# Patient Record
Sex: Female | Born: 1955 | Race: White | Hispanic: No | Marital: Single | Smoking: Former smoker
Health system: Southern US, Community
[De-identification: ages and names within clinical notes are randomized; demographics above are authoritative.]

## PROBLEM LIST (undated history)

## (undated) DIAGNOSIS — M1990 Unspecified osteoarthritis, unspecified site: Secondary | ICD-10-CM

## (undated) DIAGNOSIS — Z87442 Personal history of urinary calculi: Secondary | ICD-10-CM

## (undated) DIAGNOSIS — D649 Anemia, unspecified: Secondary | ICD-10-CM

## (undated) DIAGNOSIS — J189 Pneumonia, unspecified organism: Secondary | ICD-10-CM

## (undated) DIAGNOSIS — C569 Malignant neoplasm of unspecified ovary: Secondary | ICD-10-CM

## (undated) DIAGNOSIS — K219 Gastro-esophageal reflux disease without esophagitis: Secondary | ICD-10-CM

## (undated) HISTORY — PX: HIP ARTHROPLASTY: SHX981

## (undated) HISTORY — PX: ABDOMINAL HYSTERECTOMY: SHX81

## (undated) HISTORY — PX: FINGER SURGERY: SHX640

---

## 2020-03-16 ENCOUNTER — Encounter (HOSPITAL_COMMUNITY): Payer: Managed Care, Other (non HMO)

## 2020-03-16 ENCOUNTER — Other Ambulatory Visit (HOSPITAL_COMMUNITY): Payer: Self-pay

## 2020-03-20 ENCOUNTER — Ambulatory Visit
Admission: RE | Admit: 2020-03-20 | Payer: Managed Care, Other (non HMO) | Source: Home / Self Care | Admitting: Orthopedic Surgery

## 2020-03-20 ENCOUNTER — Encounter: Admission: RE | Payer: Self-pay | Source: Home / Self Care

## 2020-03-20 SURGERY — ARTHROPLASTY, HIP, TOTAL, ANTERIOR APPROACH
Anesthesia: Spinal | Site: Hip | Laterality: Right

## 2020-05-01 ENCOUNTER — Ambulatory Visit: Admit: 2020-05-01 | Payer: Managed Care, Other (non HMO) | Admitting: Orthopedic Surgery

## 2020-05-01 SURGERY — ARTHROPLASTY, HIP, TOTAL, ANTERIOR APPROACH
Anesthesia: Spinal | Site: Hip | Laterality: Left

## 2020-05-18 ENCOUNTER — Emergency Department (HOSPITAL_COMMUNITY): Payer: Managed Care, Other (non HMO)

## 2020-05-18 ENCOUNTER — Emergency Department (HOSPITAL_COMMUNITY)
Admission: EM | Admit: 2020-05-18 | Discharge: 2020-05-18 | Disposition: A | Discharge status: Home / Self Care | Payer: Managed Care, Other (non HMO) | Attending: Emergency Medicine | Admitting: Emergency Medicine

## 2020-05-18 ENCOUNTER — Other Ambulatory Visit: Payer: Self-pay

## 2020-05-18 ENCOUNTER — Encounter (HOSPITAL_COMMUNITY): Payer: Self-pay | Admitting: Emergency Medicine

## 2020-05-18 DIAGNOSIS — S73005A Unspecified dislocation of left hip, initial encounter: Secondary | ICD-10-CM | POA: Diagnosis not present

## 2020-05-18 DIAGNOSIS — Z96642 Presence of left artificial hip joint: Secondary | ICD-10-CM | POA: Insufficient documentation

## 2020-05-18 DIAGNOSIS — Y92003 Bedroom of unspecified non-institutional (private) residence as the place of occurrence of the external cause: Secondary | ICD-10-CM | POA: Insufficient documentation

## 2020-05-18 DIAGNOSIS — S79912A Unspecified injury of left hip, initial encounter: Secondary | ICD-10-CM | POA: Diagnosis present

## 2020-05-18 DIAGNOSIS — W06XXXA Fall from bed, initial encounter: Secondary | ICD-10-CM | POA: Insufficient documentation

## 2020-05-18 DIAGNOSIS — Z7982 Long term (current) use of aspirin: Secondary | ICD-10-CM | POA: Diagnosis not present

## 2020-05-18 DIAGNOSIS — M25562 Pain in left knee: Secondary | ICD-10-CM

## 2020-05-18 DIAGNOSIS — M25561 Pain in right knee: Secondary | ICD-10-CM | POA: Diagnosis not present

## 2020-05-18 MED ORDER — KETAMINE HCL 50 MG/5ML IJ SOSY
60.0000 mg | PREFILLED_SYRINGE | Freq: Once | INTRAMUSCULAR | Status: AC
  Administered 2020-05-18: 30 mg via INTRAVENOUS
  Filled 2020-05-18: qty 10

## 2020-05-18 MED ORDER — METHOCARBAMOL 1000 MG/10ML IJ SOLN
1000.0000 mg | Freq: Once | INTRAVENOUS | Status: AC
  Administered 2020-05-18: 1000 mg via INTRAVENOUS
  Filled 2020-05-18: qty 1000

## 2020-05-18 MED ORDER — SODIUM CHLORIDE 0.9 % IV BOLUS
1000.0000 mL | Freq: Once | INTRAVENOUS | Status: AC
  Administered 2020-05-18: 1000 mL via INTRAVENOUS

## 2020-05-18 MED ORDER — PROPOFOL 10 MG/ML IV BOLUS
60.0000 mg | Freq: Once | INTRAVENOUS | Status: AC
  Administered 2020-05-18: 30 mg via INTRAVENOUS
  Filled 2020-05-18: qty 20

## 2020-05-18 MED ORDER — ONDANSETRON HCL 4 MG/2ML IJ SOLN
4.0000 mg | Freq: Once | INTRAMUSCULAR | Status: AC
  Administered 2020-05-18: 4 mg via INTRAVENOUS
  Filled 2020-05-18: qty 2

## 2020-05-18 MED ORDER — METHOCARBAMOL 1000 MG/10ML IJ SOLN: 1000.0000 mg | Freq: Once | INTRAMUSCULAR | Status: DC

## 2020-05-18 MED ORDER — HYDROMORPHONE HCL 1 MG/ML IJ SOLN
0.5000 mg | Freq: Once | INTRAMUSCULAR | Status: AC
  Administered 2020-05-18: 0.5 mg via INTRAVENOUS
  Filled 2020-05-18: qty 1

## 2020-05-18 MED ORDER — ACETAMINOPHEN 325 MG PO TABS
650.0000 mg | ORAL_TABLET | Freq: Once | ORAL | Status: AC
  Administered 2020-05-18: 650 mg via ORAL
  Filled 2020-05-18: qty 2

## 2020-05-18 NOTE — ED Provider Notes (Signed)
[facility] DEPT Provider Note  CSN: [id number] Arrival date & time: 05/18/20 3299  Chief Complaint(s) Fall  HPI Miranda Graham is a 64 y.o. female who presents to the emergency department for left leg/hip pain. Pain is severe and throbbing. Exacerbated with movement, range of motion and palpation. Alleviated by immobility and the fentanyl given to her by EMS. She reports rolling out of bed and landing on her hip. She has a history of left hip replacement. She denies any head trauma or loss of consciousness. She is not anticoagulated. She denies any neck pain or back pain. No other extremity pain. No other physical complaints.  HPI  Past Medical History History reviewed. No pertinent past medical history. There are no problems to display for this patient.  Home Medication(s) Prior to Admission medications   Medication Sig Start Date End Date Taking? Authorizing Provider  acetaminophen (TYLENOL) 325 MG tablet Take 975 mg by mouth every 6 (six) hours as needed for mild pain, fever or headache.   Yes [provider]  aspirin 325 MG tablet Take 325 mg by mouth 2 (two) times daily.   Yes [provider]  Ferrous Sulfate (IRON PO) Take 1 tablet by mouth 2 (two) times daily.   Yes [provider]  ibuprofen (ADVIL) 200 MG tablet Take 800 mg by mouth every 6 (six) hours as needed for fever, headache or mild pain.   Yes [provider]                                                                                                                                    Past Surgical History Past Surgical History:  Procedure Laterality Date  . ABDOMINAL HYSTERECTOMY     Family History History reviewed. No pertinent family history.  Social History Social History   Tobacco Use  . Smoking status: Never Smoker  . Smokeless tobacco: Never Used  Substance Use Topics  . Alcohol use: Never  . Drug use: Never    Allergies Shellfish allergy and Levofloxacin  Review of Systems Review of Systems All other systems are reviewed and are negative for acute change except as noted in the HPI  Physical Exam Vital Signs  I have reviewed the triage vital signs BP (!) 149/88   Pulse 96   Temp 98.6 F (37 C) (Oral)   Resp 17   Ht 5\' 3"  (1.6 m)   Wt 77.1 kg   SpO2 95%   BMI 30.11 kg/m   Physical Exam Constitutional:      General: She is not in acute distress.    Appearance: She is well-developed. She is not diaphoretic.  HENT:     Head: Normocephalic and atraumatic.     Right Ear: External ear normal.     Left Ear: External ear normal.     Nose: Nose normal.  Eyes:     General: No scleral icterus.  Right eye: No discharge.        Left eye: No discharge.     Conjunctiva/sclera: Conjunctivae normal.     Pupils: Pupils are equal, round, and reactive to light.  Cardiovascular:     Rate and Rhythm: Normal rate and regular rhythm.     Pulses:          Radial pulses are 2+ on the right side and 2+ on the left side.       Dorsalis pedis pulses are 2+ on the right side and 2+ on the left side.     Heart sounds: Normal heart sounds. No murmur heard.  No friction rub. No gallop.   Pulmonary:     Effort: Pulmonary effort is normal. No respiratory distress.     Breath sounds: Normal breath sounds. No stridor. No wheezing.  Abdominal:     General: There is no distension.     Palpations: Abdomen is soft.     Tenderness: There is no abdominal tenderness.  Musculoskeletal:     Cervical back: Normal range of motion and neck supple. No bony tenderness.     Thoracic back: No bony tenderness.     Lumbar back: Tenderness present. No bony tenderness.       Back:     Left hip: Tenderness and bony tenderness present.       Legs:     Comments: Clavicles stable. Chest stable to AP/Lat compression. Pelvis stable to Lat compression. No obvious extremity deformity. No chest or abdominal wall  contusion.  Skin:    General: Skin is warm and dry.     Findings: No erythema or rash.  Neurological:     Mental Status: She is alert and oriented to person, place, and time.     Comments: Moving all extremities     ED Results and Treatments Labs (all labs ordered are listed, but only abnormal results are displayed) Labs Reviewed - No data to display                                                                                                                       EKG  EKG Interpretation  Date/Time:    Ventricular Rate:    PR Interval:    QRS Duration:   QT Interval:    QTC Calculation:   R Axis:     Text Interpretation:        Radiology DG Lumbar Spine 2-3 Views  Result Date: 05/18/2020 CLINICAL DATA:  Fall from bed.  Low back pain.  Left hip pain. EXAM: LUMBAR SPINE - 2-3 VIEW COMPARISON:  None. FINDINGS: Left hip is dislocated superiorly. Advanced degenerative changes are present the right hip. Five non rib-bearing lumbar type vertebral bodies are present. Chronic loss of disc height is present and L5-S1 slight retrolisthesis is present at L1-2. Leftward curvature is present in lumbar spine. No acute fracture or traumatic subluxation is present. Atherosclerotic changes are noted in the aorta and branch vessels without aneurysm.  IMPRESSION: 1. Superior dislocation of the left hip. 2. 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 3. Multilevel degenerative changes in the lumbar spine. 4. No acute fracture or traumatic subluxation. 5. Atherosclerosis. Electronically Signed   By: San Morelle M.D.   On: 05/18/2020 06:26   DG Knee 1-2 Views Left  Result Date: 05/18/2020 CLINICAL DATA:  Fall from bed.  Left lower extremity pain. EXAM: LEFT KNEE - 1-2 VIEW COMPARISON:  None. FINDINGS: Degenerative changes present knee. No significant effusion is present. No radiopaque foreign body is present. No acute fracture is present. IMPRESSION: 1. No acute abnormality. 2. Moderate  degenerative changes. Electronically Signed   By: San Morelle M.D.   On: 05/18/2020 06:24   DG Hip Unilat W or Wo Pelvis 2-3 Views Left  Result Date: 05/18/2020 CLINICAL DATA:  Postreduction. EXAM: DG HIP (WITH OR WITHOUT PELVIS) 2-3V LEFT COMPARISON:  Left hip radiographs 05/18/2020 at 5:29 a.m. FINDINGS: The left hip is reduced.  No fractures are present. IMPRESSION: Interval reduction of left hip dislocation. Electronically Signed   By: San Morelle M.D.   On: 05/18/2020 07:27   DG HIP UNILAT W OR W/O PELVIS 2-3 VIEWS LEFT  Result Date: 05/18/2020 CLINICAL DATA:  Fall from bed.  Left hip pain. EXAM: DG HIP (WITH OR WITHOUT PELVIS) 2-3V LEFT COMPARISON:  None. FINDINGS: Left total hip arthroplasty is present. The hip is dislocated superiorly. No fractures are present. Degenerative changes are noted in the right hip. Surgical clips are present in the pelvis bilaterally. IMPRESSION: 1. Superior dislocation of the left total hip arthroplasty. 2. No acute fracture. Electronically Signed   By: San Morelle M.D.   On: 05/18/2020 06:23   DG FEMUR MIN 2 VIEWS LEFT  Result Date: 05/18/2020 CLINICAL DATA:  Fall from bed.  Left hip pain. EXAM: LEFT FEMUR 2 VIEWS COMPARISON:  None. FINDINGS: Images the distal femur demonstrate no acute fracture. Degenerative changes are present in the knee. IMPRESSION: 1. No acute fracture. 2. Degenerative changes of the knee. Electronically Signed   By: San Morelle M.D.   On: 05/18/2020 06:24    Pertinent labs & imaging results that were available during my care of the patient were reviewed by me and considered in my medical decision making (see chart for details).  Medications Ordered in ED Medications  ondansetron (ZOFRAN) injection 4 mg (has no administration in time range)  acetaminophen (TYLENOL) tablet 650 mg (has no administration in time range)  sodium chloride 0.9 % bolus 1,000 mL (0 mLs Intravenous Stopped 05/18/20 0542)   HYDROmorphone (DILAUDID) injection 0.5 mg (0.5 mg Intravenous Given 05/18/20 0451)  methocarbamol (ROBAXIN) 1,000 mg in dextrose 5 % 100 mL IVPB (0 mg Intravenous Stopped 05/18/20 0542)  propofol (DIPRIVAN) 10 mg/mL bolus/IV push 60 mg (30 mg Intravenous Given 05/18/20 0644)  ketamine 50 mg in normal saline 5 mL (10 mg/mL) syringe (30 mg Intravenous Given 05/18/20 0644)  Procedures .Sedation  Date/Time: 05/18/2020 6:58 AM Performed by: Fatima Blank, MD Authorized by: Fatima Blank, MD   Consent:    Consent obtained:  Verbal and written   Consent given by:  Patient   Risks discussed:  Allergic reaction, dysrhythmia, inadequate sedation, nausea, prolonged hypoxia resulting in organ damage, prolonged sedation necessitating reversal, respiratory compromise necessitating ventilatory assistance and intubation and vomiting   Alternatives discussed:  Analgesia without sedation, anxiolysis and regional anesthesia Universal protocol:    Procedure explained and questions answered to patient or proxy's satisfaction: yes     Relevant documents present and verified: yes     Test results available and properly labeled: yes     Imaging studies available: yes     Required blood products, implants, devices, and special equipment available: yes     Site/side marked: yes     Immediately prior to procedure a time out was called: yes     Patient identity confirmation method:  Verbally with patient and arm band Indications:    Procedure necessitating sedation performed by:  Physician performing sedation Pre-sedation assessment:    Time since last food or drink:  2000   ASA classification: class 2 - patient with mild systemic disease     Neck mobility: normal     Mouth opening:  3 or more finger widths   Thyromental distance:  4 finger widths   Mallampati  score:  I - soft palate, uvula, fauces, pillars visible   Pre-sedation assessments completed and reviewed: airway patency, cardiovascular function, hydration status, mental status, nausea/vomiting, pain level, respiratory function and temperature     Pre-sedation assessment completed:  05/18/2020 6:00 AM Immediate pre-procedure details:    Reassessment: Patient reassessed immediately prior to procedure     Reviewed: vital signs, relevant labs/tests and NPO status     Verified: bag valve mask available, emergency equipment available, intubation equipment available, IV patency confirmed, oxygen available and suction available   Procedure details (see MAR for exact dosages):    Preoxygenation:  Nasal cannula   Sedation:  Propofol   Intended level of sedation: deep   Intra-procedure monitoring:  Blood pressure monitoring, cardiac monitor, continuous pulse oximetry, frequent LOC assessments, frequent vital sign checks and continuous capnometry   Intra-procedure events: none     Total Provider sedation time (minutes):  15 Post-procedure details:    Post-sedation assessment completed:  05/18/2020 7:04 AM   Attendance: Constant attendance by certified staff until patient recovered     Recovery: Patient returned to pre-procedure baseline     Post-sedation assessments completed and reviewed: airway patency, cardiovascular function, hydration status, mental status, nausea/vomiting, pain level, respiratory function and temperature     Patient is stable for discharge or admission: yes     Patient tolerance:  Tolerated well, no immediate complications .Ortho Injury Treatment  Date/Time: 05/18/2020 7:04 AM Performed by: Fatima Blank, MD Authorized by: Fatima Blank, MD   Consent:    Consent obtained:  Written   Consent given by:  Patient   Risks discussed:  Fracture, nerve damage, restricted joint movement, vascular damage, stiffness, recurrent dislocation and irreducible  dislocation   Alternatives discussed:  No treatmentInjury location: hip Location details: left hip Injury type: dislocation Dislocation type: posterior Prosthesis: yes Pre-procedure neurovascular assessment: neurovascularly intact  Patient sedated: Yes. Refer to sedation procedure documentation for details of sedation. Manipulation performed: yes Reduction method: Bigelow technique Reduction successful: yes X-ray confirmed reduction: yes Immobilization: brace Post-procedure neurovascular assessment: post-procedure neurovascularly  intact Patient tolerance: patient tolerated the procedure well with no immediate complications     (including critical care time)  Medical Decision Making / ED Course I have reviewed the nursing notes for this encounter and the patient's prior records (if available in EHR or on provided paperwork).   Miranda Graham was evaluated in Emergency Department on 05/18/2020 for the symptoms described in the history of present illness. She was evaluated in the context of the global COVID-19 pandemic, which necessitated consideration that the patient might be at risk for infection with the SARS-CoV-2 virus that causes COVID-19. Institutional protocols and algorithms that pertain to the evaluation of patients at risk for COVID-19 are in a state of rapid change based on information released by regulatory bodies including the CDC and federal and state organizations. These policies and algorithms were followed during the patient's care in the ED.    Clinical Course as of May 18 734  Fri May 18, 2020  0422 Rolled out of bed onto left hip. TTP of left knee, femur, hip, and lumbar. Will give IVF, IV pain meds, and muscle relaxer.  Plain films ordered to assess for fracture, dislocation, or hardware stability.   [PC]  6948 Plain films notable for left hip dislocation. Currently awaiting radiology read to determine whether she has any other subtle fractures.   [PC]   [id number] Radiology contact me reported that there were no other acute findings on plain films.  We'll move forward with procedural sedation and reduction in the emergency department.   [PC]  [id number] Reduced as above. Tolerated well. Knee immobilizer applied.  She lives will family and already has appointment with Ortho for next week.   [PC]    Clinical Course User Index [PC] Salif Tay, Grayce Sessions, MD     Final Clinical Impression(s) / ED Diagnoses Final diagnoses:  Left knee pain  History of left hip replacement   The patient appears reasonably screened and/or stabilized for discharge and I doubt any other medical condition or other [facility] requiring further screening, evaluation, or treatment in the ED at this time prior to discharge. Safe for discharge with strict return precautions.  Disposition: Discharge  Condition: Good  I have discussed the results, Dx and Tx plan with the patient/family who expressed understanding and agree(s) with the plan. Discharge instructions discussed at length. The patient/family was given strict return precautions who verbalized understanding of the instructions. No further questions at time of discharge.    ED Discharge Orders    None       Follow Up: Orthopedic Surgery  Schedule an appointment as soon as possible for a visit        This chart was dictated using voice recognition software.  Despite best efforts to proofread,  errors can occur which can change the documentation meaning.   Fatima Blank, MD 05/18/20 [phone]

## 2020-05-18 NOTE — ED Notes (Signed)
Miranda Graham (sister) - [phone]

## 2020-05-18 NOTE — ED Notes (Signed)
Patient transported to X-ray 

## 2020-05-18 NOTE — ED Triage Notes (Signed)
Pt roll out of bed while sleep c/o left hip pain IV established fentanyl 100 mcg given for pain 10/10 decreased to 5/10 Pt alert x3 no deformity noted per EMS. PMHx hip surgery right hip schedule for left hip replacement soon

## 2020-05-18 NOTE — Discharge Instructions (Addendum)
For pain control you may take at 1000 mg of Tylenol every 8 hours scheduled.

## 2020-07-25 ENCOUNTER — Other Ambulatory Visit (HOSPITAL_COMMUNITY): Payer: Self-pay | Admitting: Orthopedic Surgery

## 2020-07-25 DIAGNOSIS — M1611 Unilateral primary osteoarthritis, right hip: Secondary | ICD-10-CM

## 2020-07-25 DIAGNOSIS — Z96642 Presence of left artificial hip joint: Secondary | ICD-10-CM

## 2020-07-26 ENCOUNTER — Other Ambulatory Visit: Payer: Self-pay

## 2020-07-26 ENCOUNTER — Other Ambulatory Visit (HOSPITAL_COMMUNITY): Payer: Self-pay | Admitting: Orthopedic Surgery

## 2020-07-26 ENCOUNTER — Ambulatory Visit (HOSPITAL_COMMUNITY)
Admission: RE | Admit: 2020-07-26 | Discharge: 2020-07-26 | Disposition: A | Discharge status: Home / Self Care | Payer: Commercial Managed Care - HMO | Source: Ambulatory Visit | Attending: Vascular Surgery | Admitting: Vascular Surgery

## 2020-07-26 DIAGNOSIS — M1611 Unilateral primary osteoarthritis, right hip: Secondary | ICD-10-CM

## 2020-07-26 DIAGNOSIS — M25473 Effusion, unspecified ankle: Secondary | ICD-10-CM

## 2020-07-26 DIAGNOSIS — Z96642 Presence of left artificial hip joint: Secondary | ICD-10-CM

## 2021-03-19 NOTE — Progress Notes (Addendum)
COVID Vaccine Completed:  Yes x4 Date COVID Vaccine completed: 09-14-19, 10-19-19 Has received booster: Yes x2 COVID vaccine manufacturer: [employer]   Date of COVID positive in last 90 days: No  PCP - Elita Quick, MD notes Care Everywhere Cardiologist - Lujean Amel, MD note Care Everywhere  Chest x-ray - 08-30-20 Care Everywhere EKG - 03-20-21 Epic Stress Test - N/A ECHO - N/A Cardiac Cath - N/A Pacemaker/ICD device last checked: Spinal Cord Stimulator:  Sleep Study - N/A CPAP -   Fasting Blood Sugar - N/A Checks Blood Sugar _____ times a day  Blood Thinner Instructions:  N/A Aspirin Instructions: Last Dose:  Activity level:  Can go up a flight of stairs and perform activities of daily living without stopping and without symptoms of chest pain or shortness of breath.  Patient does have limitations due to hip pain.       Anesthesia review:  Eval by cardiology for SOB, lower extremity edema , dyspnea on exertion and chest tightness.  Patient states that all these symptoms have resolved.  No swelling noted to extremities at PAT  Patient denies shortness of breath, fever, cough and chest pain at PAT appointment  Patient verbalized understanding of instructions that were given to them at the PAT appointment. Patient was also instructed that they will need to review over the PAT instructions again at home before surgery.

## 2021-03-19 NOTE — Patient Instructions (Addendum)
DUE TO COVID-19 ONLY ONE VISITOR IS ALLOWED TO COME WITH YOU AND STAY IN THE WAITING ROOM ONLY DURING PRE OP AND PROCEDURE.   **NO VISITORS ARE ALLOWED IN THE SHORT STAY AREA OR RECOVERY ROOM!!**  IF YOU WILL BE ADMITTED INTO THE HOSPITAL YOU ARE ALLOWED ONLY TWO SUPPORT PEOPLE DURING VISITATION HOURS ONLY (10AM -8PM)   The support person(s) may change daily. The support person(s) must pass our screening, gel in and out, and wear a mask at all times, including in the patient's room. Patients must also wear a mask when staff or their support person are in the room.  No visitors under the age of 68. Any visitor under the age of 61 must be accompanied by an adult.    COVID SWAB TESTING MUST BE COMPLETED ON:  Friday, 03-29-21 between the hours of 8 and 3  **MUST PRESENT COMPLETED FORM AT TESTING SITE**    [place] [employer] (backside of the building)  You are not required to quarantine, however you are required to wear a well-fitted mask when you are out and around people not in your household.  Hand Hygiene often Do NOT share personal items Notify your provider if you are in close contact with someone who has COVID or you develop fever 100.4 or greater, new onset of sneezing, cough, sore throat, shortness of breath or body aches.   Your procedure is scheduled on: Tuesday, 04-02-21   Report to [facility] Main  Entrance   Report to admitting at 8:45 AM   Call this number if you have problems the morning of surgery [phone]   Do not eat food :After Midnight.   May have liquids until 8:30 AM day of surgery  CLEAR LIQUID DIET  Foods Allowed                                                                     Foods Excluded  Water, Black Coffee and tea, regular and decaf               liquids that you cannot  Plain Jell-O in any flavor  (No red)                                     see through such as: Fruit ices (not with fruit pulp)                                       milk, soups, orange juice              Iced Popsicles (No red)                                      All solid food                                   Apple juices Sports drinks like Gatorade (No red) Lightly  seasoned clear broth or consume(fat free) Sugar, honey syrup      Complete one Ensure drink the morning of surgery at 8:30 AM  the day of surgery.      The day of surgery:  Drink ONE (1) Pre-Surgery Clear Ensure  the morning of surgery. Drink in one sitting. Do not sip.  This drink was given to you during your hospital  pre-op appointment visit. Nothing else to drink after completing the Pre-Surgery Clear Ensure          If you have questions, please contact your surgeon's office.     Oral Hygiene is also important to reduce your risk of infection.                                    Remember - BRUSH YOUR TEETH THE MORNING OF SURGERY WITH YOUR REGULAR TOOTHPASTE   Do NOT smoke after Midnight   Take these medicines the morning of surgery with A SIP OF WATER: Gabapentin, Tramadol                             You may not have any metal on your body including hair pins, jewelry, and body piercing             Do not wear make-up, lotions, powders, perfumes, or deodorant  Do not wear nail polish including gel and S&S, artificial/acrylic nails, or any other type of covering on natural nails including finger and toenails. If you have artificial nails, gel coating, etc. that needs to be removed by a nail salon please have this removed prior to surgery or surgery may need to be canceled/ delayed if the surgeon/ anesthesia feels like they are unable to be safely monitored.   Do not shave  48 hours prior to surgery.       Do not bring valuables to the hospital. [place].   Contacts, dentures or bridgework may not be worn into surgery.   Bring small overnight bag day of surgery.   Special Instructions: Bring a copy of your healthcare  power of attorney and living will documents         the day of surgery if you haven't scanned them in before.   Please read over the following fact sheets you were given: IF YOU HAVE QUESTIONS ABOUT YOUR PRE OP INSTRUCTIONS PLEASE CALL [facility] Preparing for Surgery Before surgery, you can play an important role.  Because skin is not sterile, your skin needs to be as free of germs as possible.  You can reduce the number of germs on your skin by washing with CHG (chlorahexidine gluconate) soap before surgery.  CHG is an antiseptic cleaner which kills germs and bonds with the skin to continue killing germs even after washing. Please DO NOT use if you have an allergy to CHG or antibacterial soaps.  If your skin becomes reddened/irritated stop using the CHG and inform your nurse when you arrive at Short Stay. Do not shave (including legs and underarms) for at least 48 hours prior to the first CHG shower.  You may shave your face/neck.  Please follow these instructions carefully:  1.  Shower with CHG Soap the night before surgery and the  morning of surgery.  2.  If you choose to wash  your hair, wash your hair first as usual with your normal  shampoo.  3.  After you shampoo, rinse your hair and body thoroughly to remove the shampoo.                             4.  Use CHG as you would any other liquid soap.  You can apply chg directly to the skin and wash.  Gently with a scrungie or clean washcloth.  5.  Apply the CHG Soap to your body ONLY FROM THE NECK DOWN.   Do   not use on face/ open                           Wound or open sores. Avoid contact with eyes, ears mouth and   genitals (private parts).                       Wash face,  Genitals (private parts) with your normal soap.             6.  Wash thoroughly, paying special attention to the area where your    surgery  will be performed.  7.  Thoroughly rinse your body with warm water from the neck down.  8.  DO NOT  shower/wash with your normal soap after using and rinsing off the CHG Soap.                9.  Pat yourself dry with a clean towel.            10.  Wear clean pajamas.            11.  Place clean sheets on your bed the night of your first shower and do not  sleep with pets. Day of Surgery : Do not apply any lotions/deodorants the morning of surgery.  Please wear clean clothes to the hospital/surgery center.  FAILURE TO FOLLOW THESE INSTRUCTIONS MAY RESULT IN THE CANCELLATION OF YOUR SURGERY  PATIENT SIGNATURE_________________________________  NURSE SIGNATURE__________________________________  ________________________________________________________________________   Adam Phenix  An incentive spirometer is a tool that can help keep your lungs clear and active. This tool measures how well you are filling your lungs with each breath. Taking long deep breaths may help reverse or decrease the chance of developing breathing (pulmonary) problems (especially infection) following: A long period of time when you are unable to move or be active. BEFORE THE PROCEDURE  If the spirometer includes an indicator to show your best effort, your nurse or respiratory therapist will set it to a desired goal. If possible, sit up straight or lean slightly forward. Try not to slouch. Hold the incentive spirometer in an upright position. INSTRUCTIONS FOR USE  Sit on the edge of your bed if possible, or sit up as far as you can in bed or on a chair. Hold the incentive spirometer in an upright position. Breathe out normally. Place the mouthpiece in your mouth and seal your lips tightly around it. Breathe in slowly and as deeply as possible, raising the piston or the ball toward the top of the column. Hold your breath for 3-5 seconds or for as long as possible. Allow the piston or ball to fall to the bottom of the column. Remove the mouthpiece from your mouth and breathe out normally. Rest for a few  seconds and repeat Steps 1 through 7 at  least 10 times every 1-2 hours when you are awake. Take your time and take a few normal breaths between deep breaths. The spirometer may include an indicator to show your best effort. Use the indicator as a goal to work toward during each repetition. After each set of 10 deep breaths, practice coughing to be sure your lungs are clear. If you have an incision (the cut made at the time of surgery), support your incision when coughing by placing a pillow or rolled up towels firmly against it. Once you are able to get out of bed, walk around indoors and cough well. You may stop using the incentive spirometer when instructed by your caregiver.  RISKS AND COMPLICATIONS Take your time so you do not get dizzy or light-headed. If you are in pain, you may need to take or ask for pain medication before doing incentive spirometry. It is harder to take a deep breath if you are having pain. AFTER USE Rest and breathe slowly and easily. It can be helpful to keep track of a log of your progress. Your caregiver can provide you with a simple table to help with this. If you are using the spirometer at home, follow these instructions: [place] IF:  You are having difficultly using the spirometer. You have trouble using the spirometer as often as instructed. Your pain medication is not giving enough relief while using the spirometer. You develop fever of 100.5 F (38.1 C) or higher. SEEK IMMEDIATE MEDICAL CARE IF:  You cough up bloody sputum that had not been present before. You develop fever of 102 F (38.9 C) or greater. You develop worsening pain at or near the incision site. MAKE SURE YOU:  Understand these instructions. Will watch your condition. Will get help right away if you are not doing well or get worse. Document Released: 12/01/2006 Document Revised: 10/13/2011 Document Reviewed: 02/01/2007 ExitCare Patient Information 2014 ExitCare,  [place].   ________________________________________________________________________  WHAT IS A BLOOD TRANSFUSION? Blood Transfusion Information  A transfusion is the replacement of blood or some of its parts. Blood is made up of multiple cells which provide different functions. Red blood cells carry oxygen and are used for blood loss replacement. White blood cells fight against infection. Platelets control bleeding. Plasma helps clot blood. Other blood products are available for specialized needs, such as hemophilia or other clotting disorders. BEFORE THE TRANSFUSION  Who gives blood for transfusions?  Healthy volunteers who are fully evaluated to make sure their blood is safe. This is blood bank blood. Transfusion therapy is the safest it has ever been in the practice of medicine. Before blood is taken from a donor, a complete history is taken to make sure that person has no history of diseases nor engages in risky social behavior (examples are intravenous drug use or sexual activity with multiple partners). The donor's travel history is screened to minimize risk of transmitting infections, such as malaria. The donated blood is tested for signs of infectious diseases, such as HIV and hepatitis. The blood is then tested to be sure it is compatible with you in order to minimize the chance of a transfusion reaction. If you or a relative donates blood, this is often done in anticipation of surgery and is not appropriate for emergency situations. It takes many days to process the donated blood. RISKS AND COMPLICATIONS Although transfusion therapy is very safe and saves many lives, the main dangers of transfusion include:  Getting an infectious disease. Developing a transfusion reaction.  This is an allergic reaction to something in the blood you were given. Every precaution is taken to prevent this. The decision to have a blood transfusion has been considered carefully by your caregiver before blood is  given. Blood is not given unless the benefits outweigh the risks. AFTER THE TRANSFUSION Right after receiving a blood transfusion, you will usually feel much better and more energetic. This is especially true if your red blood cells have gotten low (anemic). The transfusion raises the level of the red blood cells which carry oxygen, and this usually causes an energy increase. The nurse administering the transfusion will monitor you carefully for complications. HOME CARE INSTRUCTIONS  No special instructions are needed after a transfusion. You may find your energy is better. Speak with your caregiver about any limitations on activity for underlying diseases you may have. SEEK MEDICAL CARE IF:  Your condition is not improving after your transfusion. You develop redness or irritation at the intravenous (IV) site. SEEK IMMEDIATE MEDICAL CARE IF:  Any of the following symptoms occur over the next 12 hours: Shaking chills. You have a temperature by mouth above 102 F (38.9 C), not controlled by medicine. Chest, back, or muscle pain. People around you feel you are not acting correctly or are confused. Shortness of breath or difficulty breathing. Dizziness and fainting. You get a rash or develop hives. You have a decrease in urine output. Your urine turns a dark color or changes to pink, red, or brown. Any of the following symptoms occur over the next 10 days: You have a temperature by mouth above 102 F (38.9 C), not controlled by medicine. Shortness of breath. Weakness after normal activity. The white part of the eye turns yellow (jaundice). You have a decrease in the amount of urine or are urinating less often. Your urine turns a dark color or changes to pink, red, or brown. Document Released: 07/18/2000 Document Revised: 10/13/2011 Document Reviewed: 03/06/2008 [facility] Patient Information 2014 [place], [place].  _______________________________________________________________________

## 2021-03-20 ENCOUNTER — Encounter (HOSPITAL_COMMUNITY): Payer: Self-pay

## 2021-03-20 ENCOUNTER — Encounter (HOSPITAL_COMMUNITY)
Admission: RE | Admit: 2021-03-20 | Discharge: 2021-03-20 | Disposition: A | Discharge status: Home / Self Care | Payer: Medicare Other | Source: Ambulatory Visit | Attending: Orthopedic Surgery | Admitting: Orthopedic Surgery

## 2021-03-20 ENCOUNTER — Other Ambulatory Visit: Payer: Self-pay

## 2021-03-20 DIAGNOSIS — I444 Left anterior fascicular block: Secondary | ICD-10-CM | POA: Insufficient documentation

## 2021-03-20 DIAGNOSIS — Z01818 Encounter for other preprocedural examination: Secondary | ICD-10-CM | POA: Diagnosis not present

## 2021-03-20 HISTORY — DX: Anemia, unspecified: D64.9

## 2021-03-20 HISTORY — DX: Personal history of urinary calculi: Z87.442

## 2021-03-20 HISTORY — DX: Malignant neoplasm of unspecified ovary: C56.9

## 2021-03-20 HISTORY — DX: Unspecified osteoarthritis, unspecified site: M19.90

## 2021-03-20 HISTORY — DX: Gastro-esophageal reflux disease without esophagitis: K21.9

## 2021-03-20 LAB — COMPREHENSIVE METABOLIC PANEL
ALT: 17 U/L (ref 0–44)
AST: 17 U/L (ref 15–41)
Albumin: 4 g/dL (ref 3.5–5.0)
Alkaline Phosphatase: 77 U/L (ref 38–126)
Anion gap: 10 (ref 5–15)
BUN: 19 mg/dL (ref 8–23)
CO2: 27 mmol/L (ref 22–32)
Calcium: 9.4 mg/dL (ref 8.9–10.3)
Chloride: 105 mmol/L (ref 98–111)
Creatinine, Ser: 0.9 mg/dL (ref 0.44–1.00)
GFR, Estimated: >60 mL/min (ref >60)
Glucose, Bld: 73 mg/dL (ref 70–99)
Potassium: 4.7 mmol/L (ref 3.5–5.1)
Sodium: 142 mmol/L (ref 135–145)
Total Bilirubin: 0.5 mg/dL (ref 0.3–1.2)
Total Protein: 7.3 g/dL (ref 6.5–8.1)

## 2021-03-20 LAB — CBC
HCT: 35.9 % — ABNORMAL LOW (ref 36.0–46.0)
Hemoglobin: 10.7 g/dL — ABNORMAL LOW (ref 12.0–15.0)
MCH: 26.6 pg (ref 26.0–34.0)
MCHC: 29.8 g/dL — ABNORMAL LOW (ref 30.0–36.0)
MCV: 89.3 fL (ref 80.0–100.0)
Platelets: 376 10*3/uL (ref 150–400)
RBC: 4.02 MIL/uL (ref 3.87–5.11)
RDW: 17.2 % — ABNORMAL HIGH (ref 11.5–15.5)
WBC: 7.6 10*3/uL (ref 4.0–10.5)
nRBC: 0 % (ref 0.0–0.2)

## 2021-03-20 LAB — SURGICAL PCR SCREEN
MRSA, PCR: NEGATIVE
Staphylococcus aureus: POSITIVE — AB

## 2021-03-20 LAB — TYPE AND SCREEN
ABO/RH(D): A POS
Antibody Screen: NEGATIVE

## 2021-03-21 NOTE — Progress Notes (Signed)
PCR results sent to Dr. Alvan Dame.

## 2021-03-21 NOTE — Progress Notes (Addendum)
Anesthesia Chart Review:   Case: [id number] Date/Time: 04/02/21 1115   Procedure: TOTAL HIP ARTHROPLASTY ANTERIOR APPROACH (Right: Hip)   Anesthesia type: Spinal   Pre-op diagnosis: Right hip osteoarthritis   Location: [facility] ROOM 10 / [facility]   Surgeons: Paralee Cancel, MD       DISCUSSION: Pt is 65 years old with hx anemia.   Saw cardiologist Lujean Amel, MD 09/25/20 for BLE edema, SOB, and DOE (notes in care everywhere). He documented pt has "vague chest tightness." His note indicates he suspects CHF and recommended echo and Lexiscan Myoview. I do not see that these tests happened.   Reviewed case with Dr. Royce Macadamia. Pt will need cardiac clearance for surgery. I notified Judeen Hammans in Dr. Aurea Graff office.   Addendum:  Pt seen by cardiology again 03/28/2021.  At this time cardiology cleared pt for surgery. Clearance on chart which states pt is low risk for planned procedure.  VS: BP 134/73   Pulse 85   Temp 37 C (Oral)   Resp 18   Ht '5\' 2"'$  (1.575 m)   Wt 79.4 kg   SpO2 98%   BMI 32.01 kg/m   PROVIDERS: - PCP is Noel Journey, MD (notes in care everywhere)    LABS: Labs reviewed: Acceptable for surgery. (all labs ordered are listed, but only abnormal results are displayed)  Labs Reviewed  SURGICAL PCR SCREEN - Abnormal; Notable for the following components:      Result Value   Staphylococcus aureus POSITIVE (*)    All other components within normal limits  CBC - Abnormal; Notable for the following components:   Hemoglobin 10.7 (*)    HCT 35.9 (*)    MCHC 29.8 (*)    RDW 17.2 (*)    All other components within normal limits  COMPREHENSIVE METABOLIC PANEL  TYPE AND SCREEN    EKG 03/20/21: NSR. LAFB. Moderate voltage criteria for LVH, may be normal variant ( R in aVL , Cornell product ). Septal infarct , age undetermined. Possible Lateral infarct , age undetermined   CV: N/A  Past Medical History:  Diagnosis Date   Anemia    Arthritis    GERD (gastroesophageal reflux  disease)    History of kidney stones    Ovarian cancer ([place])     Past Surgical History:  Procedure Laterality Date   ABDOMINAL HYSTERECTOMY     FINGER SURGERY Right    HIP ARTHROPLASTY Left     MEDICATIONS:  ferrous sulfate 325 (65 FE) MG tablet   furosemide (LASIX) 20 MG tablet   gabapentin (NEURONTIN) 100 MG capsule   methylphenidate (RITALIN) 20 MG tablet   Multiple Vitamin (MULTIVITAMIN WITH MINERALS) TABS tablet   potassium chloride (KLOR-CON) 10 MEQ tablet   traMADol (ULTRAM) 50 MG tablet   No current facility-administered medications for this encounter.    Willeen Cass, PhD, FNP-BC [facility]/Anesthesiology Phone: [phone] 03/21/2021 3:02 PM

## 2021-03-29 ENCOUNTER — Other Ambulatory Visit: Payer: Self-pay | Admitting: Orthopedic Surgery

## 2021-03-29 LAB — SARS CORONAVIRUS 2 (TAT 6-24 HRS): SARS Coronavirus 2: NEGATIVE

## 2021-03-31 NOTE — H&P (Signed)
TOTAL HIP ADMISSION H&P  Patient is admitted for right total hip arthroplasty.  Subjective:  Chief Complaint: right hip pain  HPI: Miranda Graham, 65 y.o. female, has a history of pain and functional disability in the right hip(s) due to arthritis and patient has failed non-surgical conservative treatments for greater than 12 weeks to include NSAID's and/or analgesics, corticosteriod injections, and activity modification.  Onset of symptoms was gradual starting 2 years ago with gradually worsening course since that time.The patient noted no past surgery on the right hip(s).  Patient currently rates pain in the right hip at 9 out of 10 with activity. Patient has worsening of pain with activity and weight bearing, pain that interfers with activities of daily living, and pain with passive range of motion. Patient has evidence of joint space narrowing and flattening of the femoral head  by imaging studies. This condition presents safety issues increasing the risk of falls. There is no current active infection.  There are no problems to display for this patient.  Past Medical History:  Diagnosis Date   Anemia    Arthritis    GERD (gastroesophageal reflux disease)    History of kidney stones    Ovarian cancer ([place])     Past Surgical History:  Procedure Laterality Date   ABDOMINAL HYSTERECTOMY     FINGER SURGERY Right    HIP ARTHROPLASTY Left     No current facility-administered medications for this encounter.   Current Outpatient Medications  Medication Sig Dispense Refill Last Dose   ferrous sulfate 325 (65 FE) MG tablet Take 325 mg by mouth in the morning.      furosemide (LASIX) 20 MG tablet Take 20 mg by mouth in the morning.      gabapentin (NEURONTIN) 100 MG capsule Take 100 mg by mouth in the morning.      methylphenidate (RITALIN) 20 MG tablet Take 20 mg by mouth 5 (five) times daily as needed (attention/focus).      Multiple Vitamin (MULTIVITAMIN WITH MINERALS) TABS tablet  Take 1 tablet by mouth in the morning.      potassium chloride (KLOR-CON) 10 MEQ tablet Take 10 mEq by mouth in the morning.      traMADol (ULTRAM) 50 MG tablet Take 50 mg by mouth every 6 (six) hours as needed (pain.).      Allergies  Allergen Reactions   Shellfish Allergy Itching, Swelling and Other (See Comments)    Facial swelling   Levofloxacin Other (See Comments)    Muscle Pain Pain in calves, muscle    Social History   Tobacco Use   Smoking status: Former    Packs/day: 1.00    Years: 15.00    Pack years: 15.00    Types: Cigarettes   Smokeless tobacco: Never  Substance Use Topics   Alcohol use: Never    No family history on file.   Review of Systems  Constitutional:  Negative for chills and fever.  Respiratory:  Negative for cough and shortness of breath.   Cardiovascular:  Negative for chest pain.  Gastrointestinal:  Negative for nausea and vomiting.  Musculoskeletal:  Positive for arthralgias.   Objective:  Physical Exam Well nourished and well developed. General: Alert and oriented x3, cooperative and pleasant, no acute distress. Head: normocephalic, atraumatic, neck supple. Eyes: EOMI.  Musculoskeletal: Right hip exam: Based on her radiographic appearance of her hip I chose to not aggravate her right hip with passive or active range of motion assessment. She is neurovascular  intact distally. Left hip exam: Range of motion of left hip was not assessed Tenderness laterally  Calves soft and nontender. Motor function intact in LE. Strength 5/5 LE bilaterally. Neuro: Distal pulses 2+. Sensation to light touch intact in LE.  Vital signs in last 24 hours:    Labs:   Estimated body mass index is 32.01 kg/m as calculated from the following:   Height as of 03/20/21: '5\' 2"'$  (1.575 m).   Weight as of 03/20/21: 79.4 kg.   Imaging Review Plain radiographs demonstrate severe degenerative joint disease of the right hip(s). The bone quality appears to be  adequate for age and reported activity level.      Assessment/Plan:  End stage arthritis, right hip(s)  The patient history, physical examination, clinical judgement of the provider and imaging studies are consistent with end stage degenerative joint disease of the right hip(s) and total hip arthroplasty is deemed medically necessary. The treatment options including medical management, injection therapy, arthroscopy and arthroplasty were discussed at length. The risks and benefits of total hip arthroplasty were presented and reviewed. The risks due to aseptic loosening, infection, stiffness, dislocation/subluxation,  thromboembolic complications and other imponderables were discussed.  The patient acknowledged the explanation, agreed to proceed with the plan and consent was signed. Patient is being admitted for inpatient treatment for surgery, pain control, PT, OT, prophylactic antibiotics, VTE prophylaxis, progressive ambulation and ADL's and discharge planning.The patient is planning to be discharged  home.  Therapy Plans: HEP Disposition: Home with sister Planned DVT Prophylaxis: aspirin '81mg'$  BID DME needed: none PCP: Dr. Fransisco Beau, clearance received Cardiologist: Dr. 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

## 2021-04-02 ENCOUNTER — Observation Stay (HOSPITAL_COMMUNITY): Payer: Medicare Other

## 2021-04-02 ENCOUNTER — Encounter (HOSPITAL_COMMUNITY): Payer: Self-pay | Admitting: Orthopedic Surgery

## 2021-04-02 ENCOUNTER — Encounter (HOSPITAL_COMMUNITY)
Admission: RE | Disposition: A | Discharge status: Home / Self Care | Payer: Self-pay | Source: Ambulatory Visit | Attending: Orthopedic Surgery

## 2021-04-02 ENCOUNTER — Observation Stay (HOSPITAL_COMMUNITY)
Admission: RE | Admit: 2021-04-02 | Discharge: 2021-04-03 | Disposition: A | Discharge status: Home / Self Care | Payer: Medicare Other | Source: Ambulatory Visit | Attending: Orthopedic Surgery | Admitting: Orthopedic Surgery

## 2021-04-02 ENCOUNTER — Other Ambulatory Visit: Payer: Self-pay

## 2021-04-02 ENCOUNTER — Ambulatory Visit (HOSPITAL_COMMUNITY): Payer: Medicare Other | Admitting: Anesthesiology

## 2021-04-02 ENCOUNTER — Ambulatory Visit (HOSPITAL_COMMUNITY): Payer: Medicare Other | Admitting: Emergency Medicine

## 2021-04-02 ENCOUNTER — Ambulatory Visit (HOSPITAL_COMMUNITY): Payer: Medicare Other

## 2021-04-02 DIAGNOSIS — Z96642 Presence of left artificial hip joint: Secondary | ICD-10-CM | POA: Insufficient documentation

## 2021-04-02 DIAGNOSIS — Z96649 Presence of unspecified artificial hip joint: Secondary | ICD-10-CM

## 2021-04-02 DIAGNOSIS — M1611 Unilateral primary osteoarthritis, right hip: Secondary | ICD-10-CM | POA: Diagnosis not present

## 2021-04-02 DIAGNOSIS — Z87891 Personal history of nicotine dependence: Secondary | ICD-10-CM | POA: Insufficient documentation

## 2021-04-02 DIAGNOSIS — Z8543 Personal history of malignant neoplasm of ovary: Secondary | ICD-10-CM | POA: Diagnosis not present

## 2021-04-02 DIAGNOSIS — Z419 Encounter for procedure for purposes other than remedying health state, unspecified: Secondary | ICD-10-CM

## 2021-04-02 HISTORY — PX: TOTAL HIP ARTHROPLASTY: SHX124

## 2021-04-02 LAB — ABO/RH: ABO/RH(D): A POS

## 2021-04-02 SURGERY — ARTHROPLASTY, HIP, TOTAL, ANTERIOR APPROACH
Anesthesia: Spinal | Site: Hip | Laterality: Right

## 2021-04-02 MED ORDER — PHENYLEPHRINE HCL (PRESSORS) 10 MG/ML IV SOLN
INTRAVENOUS | Status: DC | PRN
  Administered 2021-04-02: 120 ug via INTRAVENOUS

## 2021-04-02 MED ORDER — TRANEXAMIC ACID-NACL 1000-0.7 MG/100ML-% IV SOLN
1000.0000 mg | INTRAVENOUS | Status: AC
  Administered 2021-04-02: 1000 mg via INTRAVENOUS
  Filled 2021-04-02: qty 100

## 2021-04-02 MED ORDER — PROPOFOL 500 MG/50ML IV EMUL
INTRAVENOUS | Status: DC | PRN
  Administered 2021-04-02: 75 ug/kg/min via INTRAVENOUS

## 2021-04-02 MED ORDER — ORAL CARE MOUTH RINSE: 15.0000 mL | Freq: Once | OROMUCOSAL | Status: AC

## 2021-04-02 MED ORDER — METOCLOPRAMIDE HCL 5 MG/ML IJ SOLN: 5.0000 mg | Freq: Three times a day (TID) | INTRAMUSCULAR | Status: DC | PRN

## 2021-04-02 MED ORDER — METHOCARBAMOL 500 MG PO TABS
500.0000 mg | ORAL_TABLET | Freq: Four times a day (QID) | ORAL | Status: DC | PRN
  Administered 2021-04-02: 500 mg via ORAL

## 2021-04-02 MED ORDER — EPHEDRINE SULFATE 50 MG/ML IJ SOLN
INTRAMUSCULAR | Status: DC | PRN
Start: 2021-04-02 — End: 2021-04-02
  Administered 2021-04-02: 10 mg via INTRAVENOUS
  Administered 2021-04-02: 15 mg via INTRAVENOUS

## 2021-04-02 MED ORDER — PHENOL 1.4 % MT LIQD: 1.0000 | OROMUCOSAL | Status: DC | PRN

## 2021-04-02 MED ORDER — CEFAZOLIN SODIUM-DEXTROSE 2-4 GM/100ML-% IV SOLN
2.0000 g | Freq: Four times a day (QID) | INTRAVENOUS | Status: AC
Start: 2021-04-02 — End: 2021-04-03
  Administered 2021-04-02 (×2): 2 g via INTRAVENOUS
  Filled 2021-04-02 (×2): qty 100

## 2021-04-02 MED ORDER — POLYETHYLENE GLYCOL 3350 17 G PO PACK: 17.0000 g | PACK | Freq: Every day | ORAL | Status: DC | PRN

## 2021-04-02 MED ORDER — ACETAMINOPHEN 325 MG PO TABS: 325.0000 mg | ORAL_TABLET | Freq: Four times a day (QID) | ORAL | Status: DC | PRN

## 2021-04-02 MED ORDER — ONDANSETRON HCL 4 MG/2ML IJ SOLN: 4.0000 mg | Freq: Four times a day (QID) | INTRAMUSCULAR | Status: DC | PRN

## 2021-04-02 MED ORDER — DOCUSATE SODIUM 100 MG PO CAPS
100.0000 mg | ORAL_CAPSULE | Freq: Two times a day (BID) | ORAL | Status: DC
  Administered 2021-04-02 – 2021-04-03 (×2): 100 mg via ORAL
  Filled 2021-04-02 (×2): qty 1

## 2021-04-02 MED ORDER — FENTANYL CITRATE PF 50 MCG/ML IJ SOSY
PREFILLED_SYRINGE | INTRAMUSCULAR | Status: AC
  Filled 2021-04-02: qty 3

## 2021-04-02 MED ORDER — BISACODYL 10 MG RE SUPP: 10.0000 mg | Freq: Every day | RECTAL | Status: DC | PRN

## 2021-04-02 MED ORDER — BUPIVACAINE IN DEXTROSE 0.75-8.25 % IT SOLN
INTRATHECAL | Status: DC | PRN
  Administered 2021-04-02: 1.6 mL via INTRATHECAL

## 2021-04-02 MED ORDER — POTASSIUM CHLORIDE CRYS ER 10 MEQ PO TBCR
10.0000 meq | EXTENDED_RELEASE_TABLET | Freq: Every morning | ORAL | Status: DC
  Administered 2021-04-03: 10 meq via ORAL
  Filled 2021-04-02: qty 1

## 2021-04-02 MED ORDER — ONDANSETRON HCL 4 MG PO TABS: 4.0000 mg | ORAL_TABLET | Freq: Four times a day (QID) | ORAL | Status: DC | PRN

## 2021-04-02 MED ORDER — DEXAMETHASONE SODIUM PHOSPHATE 10 MG/ML IJ SOLN
8.0000 mg | Freq: Once | INTRAMUSCULAR | Status: AC
  Administered 2021-04-02: 5 mg via INTRAVENOUS

## 2021-04-02 MED ORDER — METHYLPHENIDATE HCL 10 MG PO TABS: 20.0000 mg | ORAL_TABLET | Freq: Every day | ORAL | Status: DC | PRN

## 2021-04-02 MED ORDER — POVIDONE-IODINE 10 % EX SWAB
2.0000 "application " | Freq: Once | CUTANEOUS | Status: AC
  Administered 2021-04-02: 2 via TOPICAL

## 2021-04-02 MED ORDER — CHLORHEXIDINE GLUCONATE 0.12 % MT SOLN
15.0000 mL | Freq: Once | OROMUCOSAL | Status: AC
  Administered 2021-04-02: 15 mL via OROMUCOSAL

## 2021-04-02 MED ORDER — PHENYLEPHRINE HCL-NACL 20-0.9 MG/250ML-% IV SOLN
INTRAVENOUS | Status: DC | PRN
  Administered 2021-04-02: 40 ug/min via INTRAVENOUS

## 2021-04-02 MED ORDER — METHOCARBAMOL 500 MG PO TABS
ORAL_TABLET | ORAL | Status: AC
  Filled 2021-04-02: qty 1

## 2021-04-02 MED ORDER — FERROUS SULFATE 325 (65 FE) MG PO TABS
325.0000 mg | ORAL_TABLET | Freq: Three times a day (TID) | ORAL | Status: DC
  Administered 2021-04-03 (×2): 325 mg via ORAL
  Filled 2021-04-02 (×2): qty 1

## 2021-04-02 MED ORDER — HYDROCODONE-ACETAMINOPHEN 7.5-325 MG PO TABS
ORAL_TABLET | ORAL | Status: AC
  Administered 2021-04-02: 1 via ORAL
  Filled 2021-04-02: qty 1

## 2021-04-02 MED ORDER — SODIUM CHLORIDE 0.9 % IV SOLN: INTRAVENOUS | Status: DC

## 2021-04-02 MED ORDER — METOCLOPRAMIDE HCL 5 MG PO TABS: 5.0000 mg | ORAL_TABLET | Freq: Three times a day (TID) | ORAL | Status: DC | PRN

## 2021-04-02 MED ORDER — METHOCARBAMOL 500 MG IVPB - SIMPLE MED
500.0000 mg | Freq: Four times a day (QID) | INTRAVENOUS | Status: DC | PRN
  Filled 2021-04-02: qty 50

## 2021-04-02 MED ORDER — DEXAMETHASONE SODIUM PHOSPHATE 10 MG/ML IJ SOLN
10.0000 mg | Freq: Once | INTRAMUSCULAR | Status: AC
  Administered 2021-04-03: 10 mg via INTRAVENOUS
  Filled 2021-04-02: qty 1

## 2021-04-02 MED ORDER — ACETAMINOPHEN 500 MG PO TABS
1000.0000 mg | ORAL_TABLET | Freq: Once | ORAL | Status: AC
  Administered 2021-04-02: 1000 mg via ORAL
  Filled 2021-04-02: qty 2

## 2021-04-02 MED ORDER — LACTATED RINGERS IV SOLN: INTRAVENOUS | Status: DC

## 2021-04-02 MED ORDER — MORPHINE SULFATE (PF) 2 MG/ML IV SOLN
0.5000 mg | INTRAVENOUS | Status: DC | PRN
  Administered 2021-04-02: 1 mg via INTRAVENOUS
  Filled 2021-04-02: qty 1

## 2021-04-02 MED ORDER — DIPHENHYDRAMINE HCL 12.5 MG/5ML PO ELIX: 12.5000 mg | ORAL_SOLUTION | ORAL | Status: DC | PRN

## 2021-04-02 MED ORDER — FENTANYL CITRATE PF 50 MCG/ML IJ SOSY: 25.0000 ug | PREFILLED_SYRINGE | INTRAMUSCULAR | Status: DC | PRN

## 2021-04-02 MED ORDER — FUROSEMIDE 20 MG PO TABS
20.0000 mg | ORAL_TABLET | Freq: Every morning | ORAL | Status: DC
  Administered 2021-04-03: 20 mg via ORAL
  Filled 2021-04-02: qty 1

## 2021-04-02 MED ORDER — PROMETHAZINE HCL 25 MG/ML IJ SOLN
INTRAMUSCULAR | Status: AC
  Filled 2021-04-02: qty 1

## 2021-04-02 MED ORDER — TRANEXAMIC ACID-NACL 1000-0.7 MG/100ML-% IV SOLN
1000.0000 mg | Freq: Once | INTRAVENOUS | Status: AC
  Administered 2021-04-02: 1000 mg via INTRAVENOUS
  Filled 2021-04-02: qty 100

## 2021-04-02 MED ORDER — FENTANYL CITRATE (PF) 100 MCG/2ML IJ SOLN
INTRAMUSCULAR | Status: DC | PRN
  Administered 2021-04-02: 100 ug via INTRAVENOUS

## 2021-04-02 MED ORDER — ASPIRIN 81 MG PO CHEW
81.0000 mg | CHEWABLE_TABLET | Freq: Two times a day (BID) | ORAL | Status: DC
  Administered 2021-04-02 – 2021-04-03 (×2): 81 mg via ORAL
  Filled 2021-04-02 (×2): qty 1

## 2021-04-02 MED ORDER — GABAPENTIN 100 MG PO CAPS
100.0000 mg | ORAL_CAPSULE | Freq: Every morning | ORAL | Status: DC
  Administered 2021-04-03: 100 mg via ORAL
  Filled 2021-04-02: qty 1

## 2021-04-02 MED ORDER — FENTANYL CITRATE (PF) 100 MCG/2ML IJ SOLN
INTRAMUSCULAR | Status: AC
  Filled 2021-04-02: qty 2

## 2021-04-02 MED ORDER — STERILE WATER FOR IRRIGATION IR SOLN
Status: DC | PRN
  Administered 2021-04-02: 2000 mL

## 2021-04-02 MED ORDER — PROMETHAZINE HCL 25 MG/ML IJ SOLN
6.2500 mg | INTRAMUSCULAR | Status: DC | PRN
  Administered 2021-04-02: 6.25 mg via INTRAVENOUS

## 2021-04-02 MED ORDER — HYDROCODONE-ACETAMINOPHEN 7.5-325 MG PO TABS
1.0000 | ORAL_TABLET | ORAL | Status: DC | PRN
  Administered 2021-04-02: 1 via ORAL
  Administered 2021-04-02 – 2021-04-03 (×4): 2 via ORAL
  Filled 2021-04-02 (×4): qty 2

## 2021-04-02 MED ORDER — SODIUM CHLORIDE 0.9 % IR SOLN
Status: DC | PRN
  Administered 2021-04-02: 1000 mL

## 2021-04-02 MED ORDER — HYDROCODONE-ACETAMINOPHEN 7.5-325 MG PO TABS
ORAL_TABLET | ORAL | Status: AC
  Filled 2021-04-02: qty 1

## 2021-04-02 MED ORDER — CEFAZOLIN SODIUM-DEXTROSE 2-4 GM/100ML-% IV SOLN
2.0000 g | INTRAVENOUS | Status: AC
  Administered 2021-04-02: 2 g via INTRAVENOUS
  Filled 2021-04-02: qty 100

## 2021-04-02 MED ORDER — MENTHOL 3 MG MT LOZG: 1.0000 | LOZENGE | OROMUCOSAL | Status: DC | PRN

## 2021-04-02 MED ORDER — METHOCARBAMOL 500 MG IVPB - SIMPLE MED
INTRAVENOUS | Status: AC
  Filled 2021-04-02: qty 50

## 2021-04-02 MED ORDER — ONDANSETRON HCL 4 MG/2ML IJ SOLN
INTRAMUSCULAR | Status: DC | PRN
  Administered 2021-04-02: 4 mg via INTRAVENOUS

## 2021-04-02 MED ORDER — HYDROCODONE-ACETAMINOPHEN 5-325 MG PO TABS: 1.0000 | ORAL_TABLET | ORAL | Status: DC | PRN

## 2021-04-02 SURGICAL SUPPLY — 43 items
ADH SKN CLS APL DERMABOND .7 (GAUZE/BANDAGES/DRESSINGS) ×1
BAG COUNTER SPONGE SURGICOUNT (BAG) IMPLANT
BAG DECANTER FOR FLEXI CONT (MISCELLANEOUS) IMPLANT
BAG SPEC THK2 15X12 ZIP CLS (MISCELLANEOUS)
BAG SPNG CNTER NS LX DISP (BAG)
BAG SURGICOUNT SPONGE COUNTING (BAG)
BAG ZIPLOCK 12X15 (MISCELLANEOUS) IMPLANT
BLADE SAG 18X100X1.27 (BLADE) ×3 IMPLANT
COVER PERINEAL POST (MISCELLANEOUS) ×3 IMPLANT
COVER SURGICAL LIGHT HANDLE (MISCELLANEOUS) ×3 IMPLANT
CUP ACETBLR 52 OD PINNACLE (Hips) ×2 IMPLANT
DERMABOND ADVANCED (GAUZE/BANDAGES/DRESSINGS) ×2
DERMABOND ADVANCED .7 DNX12 (GAUZE/BANDAGES/DRESSINGS) ×1 IMPLANT
DRAPE FOOT SWITCH (DRAPES) ×3 IMPLANT
DRAPE STERI IOBAN 125X83 (DRAPES) ×3 IMPLANT
DRAPE U-SHAPE 47X51 STRL (DRAPES) ×6 IMPLANT
DRESSING AQUACEL AG SP 3.5X10 (GAUZE/BANDAGES/DRESSINGS) ×1 IMPLANT
DRSG AQUACEL AG SP 3.5X10 (GAUZE/BANDAGES/DRESSINGS) ×3
DURAPREP 26ML APPLICATOR (WOUND CARE) ×3 IMPLANT
ELECT REM PT RETURN 15FT ADLT (MISCELLANEOUS) ×3 IMPLANT
ELIMINATOR HOLE APEX DEPUY (Hips) ×2 IMPLANT
GLOVE SURG ENC MOIS LTX SZ6 (GLOVE) ×6 IMPLANT
GLOVE SURG UNDER LTX SZ7.5 (GLOVE) ×3 IMPLANT
GLOVE SURG UNDER POLY LF SZ6.5 (GLOVE) ×3 IMPLANT
GLOVE SURG UNDER POLY LF SZ7.5 (GLOVE) ×6 IMPLANT
GOWN STRL REUS W/TWL LRG LVL3 (GOWN DISPOSABLE) ×6 IMPLANT
HEAD CERAMIC 36 PLUS5 (Hips) ×2 IMPLANT
HOLDER FOLEY CATH W/STRAP (MISCELLANEOUS) ×3 IMPLANT
KIT TURNOVER KIT A (KITS) ×3 IMPLANT
LINER NEUTRAL 52X36MM PLUS 4 (Liner) ×2 IMPLANT
PACK ANTERIOR HIP CUSTOM (KITS) ×3 IMPLANT
PENCIL SMOKE EVACUATOR (MISCELLANEOUS) IMPLANT
SCREW 6.5MMX30MM (Screw) ×2 IMPLANT
STEM FEMORAL SZ6 HIGH ACTIS (Stem) ×2 IMPLANT
SUT MNCRL AB 4-0 PS2 18 (SUTURE) ×3 IMPLANT
SUT STRATAFIX 0 PDS 27 VIOLET (SUTURE) ×3
SUT VIC AB 1 CT1 36 (SUTURE) ×9 IMPLANT
SUT VIC AB 2-0 CT1 27 (SUTURE) ×6
SUT VIC AB 2-0 CT1 TAPERPNT 27 (SUTURE) ×2 IMPLANT
SUTURE STRATFX 0 PDS 27 VIOLET (SUTURE) ×1 IMPLANT
TRAY FOLEY MTR SLVR 16FR STAT (SET/KITS/TRAYS/PACK) IMPLANT
TUBE SUCTION HIGH CAP CLEAR NV (SUCTIONS) ×3 IMPLANT
WATER STERILE IRR 1000ML POUR (IV SOLUTION) ×3 IMPLANT

## 2021-04-02 NOTE — Discharge Instructions (Signed)

## 2021-04-02 NOTE — Anesthesia Postprocedure Evaluation (Signed)
Anesthesia Post Note  Patient: Miranda Graham  Procedure(s) Performed: TOTAL HIP ARTHROPLASTY ANTERIOR APPROACH (Right: Hip)     Patient location during evaluation: PACU Anesthesia Type: Spinal Level of consciousness: oriented, awake and alert and awake Pain management: pain level controlled Vital Signs Assessment: post-procedure vital signs reviewed and stable Respiratory status: spontaneous breathing, respiratory function stable and nonlabored ventilation Cardiovascular status: blood pressure returned to baseline and stable Postop Assessment: no headache, no backache, no apparent nausea or vomiting, patient able to bend at knees and spinal receding Anesthetic complications: no   No notable events documented.  Last Vitals:  Vitals:   04/02/21 1600 04/02/21 1643  BP: 123/68 118/64  Pulse: 95 87  Resp: 16 16  Temp: 36.6 C 37 C  SpO2: 96% 93%    Last Pain:  Vitals:   04/02/21 1643  TempSrc: Oral  PainSc:                  Catalina Gravel

## 2021-04-02 NOTE — Interval H&P Note (Signed)
History and Physical Interval Note:  04/02/2021 9:37 AM  Miranda Graham  has presented today for surgery, with the diagnosis of Right hip osteoarthritis.  The various methods of treatment have been discussed with the patient and family. After consideration of risks, benefits and other options for treatment, the patient has consented to  Procedure(s): TOTAL HIP ARTHROPLASTY ANTERIOR APPROACH (Right) as a surgical intervention.  The patient's history has been reviewed, patient examined, no change in status, stable for surgery.  I have reviewed the patient's chart and labs.  Questions were answered to the patient's satisfaction.     Mauri Pole

## 2021-04-02 NOTE — Anesthesia Procedure Notes (Signed)
Spinal  Patient location during procedure: OR Start time: 04/02/2021 10:58 AM End time: 04/02/2021 11:01 AM Reason for block: surgical anesthesia Staffing Performed: anesthesiologist  Anesthesiologist: Catalina Gravel, MD Preanesthetic Checklist Completed: patient identified, IV checked, risks and benefits discussed, surgical consent, monitors and equipment checked, pre-op evaluation and timeout performed Spinal Block Patient position: sitting Prep: DuraPrep and site prepped and draped Patient monitoring: continuous pulse ox and blood pressure Approach: midline Location: L3-4 Injection technique: single-shot Needle Needle type: Pencan  Needle gauge: 24 G Assessment Events: CSF return Additional Notes Functioning IV was confirmed and monitors were applied. Sterile prep and drape, including hand hygiene, mask and sterile gloves were used. The patient was positioned and the spine was prepped. The skin was anesthetized with lidocaine.  Free flow of clear CSF was obtained prior to injecting local anesthetic into the CSF.  The spinal needle aspirated freely following injection.  The needle was carefully withdrawn.  The patient tolerated the procedure well. Consent was obtained prior to procedure with all questions answered and concerns addressed. Risks including but not limited to bleeding, infection, nerve damage, paralysis, failed block, inadequate analgesia, allergic reaction, high spinal, itching and headache were discussed and the patient wished to proceed.   Hoy Morn, MD

## 2021-04-02 NOTE — Op Note (Signed)
NAME:  Miranda Graham                ACCOUNT NO.: [id number]      MEDICAL RECORD NO.: [id number]      FACILITY:  [facility]      PHYSICIAN:  Mauri Pole  DATE OF BIRTH:  Sep 20, 1955     DATE OF PROCEDURE:  04/02/2021                                 OPERATIVE REPORT         PREOPERATIVE DIAGNOSIS: Right  hip osteoarthritis.      POSTOPERATIVE DIAGNOSIS:  Right hip osteoarthritis.      PROCEDURE:  Right total hip replacement through an anterior approach   utilizing DePuy THR system, component size 52 mm pinnacle cup, a size 36+4 neutral   Altrex liner, a size 6 Hi Actis stem with a 36+5 delta ceramic   ball.      SURGEON:  Pietro Cassis. Alvan Dame, M.D.      ASSISTANT:  Costella Hatcher, PA-C     ANESTHESIA:  Spinal.      SPECIMENS:  None.      COMPLICATIONS:  None.      BLOOD LOSS:  150 cc     DRAINS:  None.      INDICATION OF THE PROCEDURE:  Miranda Graham is a 65 y.o. female who had   presented to office for evaluation of right hip pain.  Radiographs revealed   progressive degenerative changes with bone-on-bone   articulation of the  hip joint, including subchondral cystic changes and osteophytes.  The patient had painful limited range of   motion significantly affecting their overall quality of life and function.  The patient was failing to    respond to conservative measures including medications and/or injections and activity modification and at this point was ready   to proceed with more definitive measures.  Consent was obtained for   benefit of pain relief.  Specific risks of infection, DVT, component   failure, dislocation, neurovascular injury, and need for revision surgery were reviewed in the office as well discussion of   the anterior versus posterior approach were reviewed.  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     PROCEDURE IN DETAIL:  The patient was brought to operative theater.    Once adequate anesthesia, preoperative antibiotics, 2 gm of Ancef, 1 gm of Tranexamic Acid, and 10 mg of Decadron were administered, the patient was positioned supine on the [employer] table.  Once the patient was safely positioned with adequate padding of boney prominences we predraped out the hip, and used fluoroscopy to confirm orientation of the pelvis.      The right hip was then prepped and draped from proximal iliac crest to   mid thigh with a shower curtain technique.      Time-out was performed identifying the patient, planned procedure, and the appropriate extremity.     An incision was then made 2 cm lateral to the   anterior superior iliac spine extending over the orientation of the   tensor fascia lata muscle and sharp dissection was carried down to the   fascia of the muscle.      The fascia was then incised.  The muscle belly was identified and swept   laterally and retractor placed along the  superior neck.  Following   cauterization of the circumflex vessels and removing some pericapsular   fat, a second cobra retractor was placed on the inferior neck.  A T-capsulotomy was made along the line of the   superior neck to the trochanteric fossa, then extended proximally and   distally.  Tag sutures were placed and the retractors were then placed   intracapsular.  We then identified the trochanteric fossa and   orientation of my neck cut and then made a neck osteotomy with the femur on traction.  The femoral   head was removed without difficulty or complication.  Traction was let   off and retractors were placed posterior and anterior around the   acetabulum.      The labrum and foveal tissue were debrided.  I began reaming with a 45 mm   reamer and reamed up to 51 mm reamer with good bony bed preparation and a 52 mm  cup was chosen.  The final 52 mm Pinnacle cup was then impacted under fluoroscopy to confirm the depth of penetration and orientation with respect to   Abduction  and forward flexion.  A screw was placed into the ilium followed by the hole eliminator.  The final   36+4 neutral Altrex liner was impacted with good visualized rim fit.  The cup was positioned anatomically within the acetabular portion of the pelvis.      At this point, the femur was rolled to 100 degrees.  Further capsule was   released off the inferior aspect of the femoral neck.  I then   released the superior capsule proximally.  With the leg in a neutral position the hook was placed laterally   along the femur under the vastus lateralis origin and elevated manually and then held in position using the hook attachment on the bed.  The leg was then extended and adducted with the leg rolled to 100   degrees of external rotation.  Retractors were placed along the medial calcar and posteriorly over the greater trochanter.  Once the proximal femur was fully   exposed, I used a box osteotome to set orientation.  I then began   broaching with the starting chili pepper broach and passed this by hand and then broached up to 6.  With the 6 broach in place I chose a high offset neck and did several trial reductions.  The offset was appropriate, leg lengths   appeared to be equal best matched with the +5 vs the +1.5 head ball trial confirmed radiographically.   Given these findings, I went ahead and dislocated the hip, repositioned all   retractors and positioned the right hip in the extended and abducted position.  The final 6 Hi Actis stem was   chosen and it was impacted down to the level of neck cut.  Based on this   and the trial reductions, a final 36+5 delta ceramic ball was chosen and   impacted onto a clean and dry trunnion, and the hip was reduced.  The   hip had been irrigated throughout the case again at this point.  I did   reapproximate the superior capsular leaflet to the anterior leaflet   using #1 Vicryl.  The fascia of the   tensor fascia lata muscle was then reapproximated using #1  Vicryl and #0 Stratafix sutures.  The   remaining wound was closed with 2-0 Vicryl and running 4-0 Monocryl.   The hip was cleaned, dried, and dressed  sterilely using Dermabond and   Aquacel dressing.  The patient was then brought   to recovery room in stable condition tolerating the procedure well.    Costella Hatcher, PA-C was present for the entirety of the case involved from   preoperative positioning, perioperative retractor management, general   facilitation of the case, as well as primary wound closure as assistant.            Pietro Cassis Alvan Dame, M.D.        04/02/2021 11:03 AM

## 2021-04-02 NOTE — Transfer of Care (Signed)
Immediate Anesthesia Transfer of Care Note  Patient: Miranda Graham  Procedure(s) Performed: TOTAL HIP ARTHROPLASTY ANTERIOR APPROACH (Right: Hip)  Patient Location: PACU  Anesthesia Type:Spinal  Level of Consciousness: awake, alert  and oriented  Airway & Oxygen Therapy: Patient Spontanous Breathing and Patient connected to face mask oxygen  Post-op Assessment: Report given to RN and Post -op Vital signs reviewed and stable  Post vital signs: Reviewed and stable  Last Vitals:  Vitals Value Taken Time  BP    Temp    Pulse 75 04/02/21 1224  Resp 14 04/02/21 1224  SpO2 98 % 04/02/21 1224  Vitals shown include unvalidated device data.  Last Pain:  Vitals:   04/02/21 0847  TempSrc: Oral  PainSc: 0-No pain      Patients Stated Pain Goal: 3 ([id number])  Complications: No notable events documented.

## 2021-04-02 NOTE — Progress Notes (Signed)
Orthopedic Tech Progress Note Patient Details:  Miranda Graham June 20, 1956 [id number]     Post Interventions Patient Tolerated: Well Instructions Provided: Care of device  [place] Pink 04/02/2021, 6:30 PM

## 2021-04-02 NOTE — Anesthesia Preprocedure Evaluation (Signed)
Anesthesia Evaluation  Patient identified by MRN, date of birth, ID band Patient awake    Reviewed: Allergy & Precautions, NPO status , Patient's Chart, lab work & pertinent test results  Airway Mallampati: II  TM Distance: >3 FB Neck ROM: Full    Dental  (+) Teeth Intact, Dental Advisory Given   Pulmonary former smoker,    Pulmonary exam normal breath sounds clear to auscultation       Cardiovascular negative cardio ROS Normal cardiovascular exam Rhythm:Regular Rate:Normal     Neuro/Psych negative neurological ROS  negative psych ROS   GI/Hepatic Neg liver ROS, GERD  ,  Endo/Other  Obesity   Renal/GU negative Renal ROS     Musculoskeletal  (+) Arthritis ,   Abdominal   Peds  Hematology  (+) Blood dyscrasia, anemia , Plt 376k   Anesthesia Other Findings Day of surgery medications reviewed with the patient.  Reproductive/Obstetrics Ovarian cancer                              Anesthesia Physical Anesthesia Plan  ASA: 2  Anesthesia Plan: Spinal   Post-op Pain Management:    Induction: Intravenous  PONV Risk Score and Plan: 2 and Propofol infusion, Midazolam, Dexamethasone and Ondansetron  Airway Management Planned: Natural Airway and Nasal Cannula  Additional Equipment:   Intra-op Plan:   Post-operative Plan:   Informed Consent: I have reviewed the patients History and Physical, chart, labs and discussed the procedure including the risks, benefits and alternatives for the proposed anesthesia with the patient or authorized representative who has indicated his/her understanding and acceptance.     Dental advisory given  Plan Discussed with: CRNA, Anesthesiologist and Surgeon  Anesthesia Plan Comments:         Anesthesia Quick Evaluation

## 2021-04-03 ENCOUNTER — Encounter (HOSPITAL_COMMUNITY): Payer: Self-pay | Admitting: Orthopedic Surgery

## 2021-04-03 DIAGNOSIS — M1611 Unilateral primary osteoarthritis, right hip: Secondary | ICD-10-CM | POA: Diagnosis not present

## 2021-04-03 LAB — CBC
HCT: 26 % — ABNORMAL LOW (ref 36.0–46.0)
Hemoglobin: 7.9 g/dL — ABNORMAL LOW (ref 12.0–15.0)
MCH: 26.8 pg (ref 26.0–34.0)
MCHC: 30.4 g/dL (ref 30.0–36.0)
MCV: 88.1 fL (ref 80.0–100.0)
Platelets: 264 10*3/uL (ref 150–400)
RBC: 2.95 MIL/uL — ABNORMAL LOW (ref 3.87–5.11)
RDW: 16.3 % — ABNORMAL HIGH (ref 11.5–15.5)
WBC: 9.3 10*3/uL (ref 4.0–10.5)
nRBC: 0 % (ref 0.0–0.2)

## 2021-04-03 LAB — BASIC METABOLIC PANEL
Anion gap: 8 (ref 5–15)
BUN: 17 mg/dL (ref 8–23)
CO2: 28 mmol/L (ref 22–32)
Calcium: 8.7 mg/dL — ABNORMAL LOW (ref 8.9–10.3)
Chloride: 107 mmol/L (ref 98–111)
Creatinine, Ser: 0.77 mg/dL (ref 0.44–1.00)
GFR, Estimated: >60 mL/min (ref >60)
Glucose, Bld: 114 mg/dL — ABNORMAL HIGH (ref 70–99)
Potassium: 4 mmol/L (ref 3.5–5.1)
Sodium: 143 mmol/L (ref 135–145)

## 2021-04-03 MED ORDER — POLYETHYLENE GLYCOL 3350 17 G PO PACK: 17.0000 g | PACK | Freq: Every day | ORAL | 0 refills | Status: DC | PRN

## 2021-04-03 MED ORDER — FERROUS SULFATE 325 (65 FE) MG PO TABS
325.0000 mg | ORAL_TABLET | Freq: Three times a day (TID) | ORAL | 0 refills | Status: DC
Start: 2021-04-03 — End: 2021-05-14

## 2021-04-03 MED ORDER — METHOCARBAMOL 500 MG PO TABS: 500.0000 mg | ORAL_TABLET | Freq: Four times a day (QID) | ORAL | 0 refills | Status: DC | PRN

## 2021-04-03 MED ORDER — DOCUSATE SODIUM 100 MG PO CAPS: 100.0000 mg | ORAL_CAPSULE | Freq: Two times a day (BID) | ORAL | 0 refills | Status: DC

## 2021-04-03 MED ORDER — ASPIRIN 81 MG PO CHEW: 81.0000 mg | CHEWABLE_TABLET | Freq: Two times a day (BID) | ORAL | 0 refills | Status: AC

## 2021-04-03 MED ORDER — HYDROCODONE-ACETAMINOPHEN 7.5-325 MG PO TABS: 1.0000 | ORAL_TABLET | Freq: Four times a day (QID) | ORAL | 0 refills | Status: DC | PRN

## 2021-04-03 NOTE — Plan of Care (Signed)
Pt ready to DC home after lunch with sister.

## 2021-04-03 NOTE — Evaluation (Signed)
Physical Therapy Evaluation Patient Details Name: Miranda Graham MRN: [id number] DOB: 01/18/1956 Today's Date: 04/03/2021   History of Present Illness  65 y.o. female admitted 04/02/2021 for R AA-THA. PMH includes anemia, ovarian cancer. L anterolateral THA August 2021 with 4 dislocations in past 1 year.  Clinical Impression  Pt is ready to DC from a PT standpoint. She ambulated 170', completed stair training, and demonstrates understanding of HEP.     Follow Up Recommendations Follow surgeon's recommendation for DC plan and follow-up therapies    Equipment Recommendations  None recommended by PT    Recommendations for Other Services       Precautions / Restrictions Precautions Precautions: Fall Precaution Comments: L hip has had 4 dislocations in past year, had L THA August 2021. She has no hip precautions. Plans to have revision soon. Restrictions Weight Bearing Restrictions: No      Mobility  Bed Mobility               General bed mobility comments: up in recliner    Transfers Overall transfer level: Needs assistance Equipment used: Rolling walker (2 wheeled) Transfers: Sit to/from Stand Sit to Stand: Supervision         General transfer comment: VCs hand placement  Ambulation/Gait Ambulation/Gait assistance: Supervision Gait Distance (Feet): 170 Feet Assistive device: Rolling walker (2 wheeled) Gait Pattern/deviations: Step-to pattern Gait velocity: WFL   General Gait Details: steady, no loss of balance  Stairs Stairs: Yes Stairs assistance: Min guard Stair Management: No rails;Backwards;With walker Number of Stairs: 1 General stair comments: VCs sequencing, min/guard safety  Wheelchair Mobility    Modified Rankin (Stroke Patients Only)       Balance Overall balance assessment: Modified Independent                                           Pertinent Vitals/Pain Pain Assessment: 0-10 Pain Score: 5  Pain Location:  R hip Pain Descriptors / Indicators: Aching Pain Intervention(s): Limited activity within patient's tolerance;Monitored during session;Premedicated before session;Ice applied    Home Living Family/patient expects to be discharged to:: Private residence Living Arrangements: Alone Available Help at Discharge: Family;Available 24 hours/day Type of Home: House Home Access: Stairs to enter   [employer] of Steps: 1 Home Layout: One level Home Equipment: Shower seat;Hand held Tourist information centre manager - 2 wheels Additional Comments: will stay with sister for a month as pt lives alone in [place]. Info above is for sister's home.    Prior Function Level of Independence: Independent with assistive device(s)         Comments: used RW     Hand Dominance        Extremity/Trunk Assessment   Upper Extremity Assessment Upper Extremity Assessment: Overall WFL for tasks assessed    Lower Extremity Assessment Lower Extremity Assessment: RLE deficits/detail RLE Deficits / Details: hip -3/5, knee ext 4/5 RLE Sensation: WNL RLE Coordination: WNL    Cervical / Trunk Assessment Cervical / Trunk Assessment: Normal  Communication   Communication: No difficulties  Cognition Arousal/Alertness: Awake/alert Behavior During Therapy: WFL for tasks assessed/performed Overall Cognitive Status: Within Functional Limits for tasks assessed                                        General Comments  Exercises Total Joint Exercises Ankle Circles/Pumps: AROM;Both;10 reps Quad Sets: AROM;Right;5 reps;Supine Short Arc Quad: AROM;Right;10 reps;Supine Heel Slides: AAROM;Right;10 reps;Supine Hip ABduction/ADduction: AAROM;Right;10 reps;Supine Long Arc Quad: AROM;Right;10 reps;Seated   Assessment/Plan    PT Assessment Patent does not need any further PT services  PT Problem List Pain;Decreased activity tolerance;Decreased strength       PT Treatment Interventions       PT Goals (Current goals can be found in the Care Plan section)  Acute Rehab PT Goals Patient Stated Goal: yoga, work part time as Marine scientist, get L hip fixed PT Goal Formulation: All assessment and education complete, DC therapy    Frequency     Barriers to discharge        Co-evaluation               AM-PAC PT "6 Clicks" Mobility  Outcome Measure Help needed turning from your back to your side while in a flat bed without using bedrails?: None Help needed moving from lying on your back to sitting on the side of a flat bed without using bedrails?: A Little Help needed moving to and from a bed to a chair (including a wheelchair)?: None Help needed standing up from a chair using your arms (e.g., wheelchair or bedside chair)?: None Help needed to walk in hospital room?: None Help needed climbing 3-5 steps with a railing? : A Little 6 Click Score: 22    End of Session Equipment Utilized During Treatment: Gait belt Activity Tolerance: Patient tolerated treatment well Patient left: in chair;with call bell/phone within reach;with chair alarm set Nurse Communication: Mobility status PT Visit Diagnosis: Difficulty in walking, not elsewhere classified (R26.2);Pain Pain - Right/Left: Right Pain - part of body: Hip    Time: [id number] PT Time Calculation (min) (ACUTE ONLY): 49 min   Charges:   PT Evaluation $PT Eval Moderate Complexity: 1 Mod PT Treatments $Gait Training: 8-22 mins $Therapeutic Exercise: 8-22 mins        Blondell Reveal Kistler PT 04/03/2021  Acute Rehabilitation Services Pager [phone] Office [phone]

## 2021-04-03 NOTE — TOC Transition Note (Signed)
Transition of Care [facility]) - CM/SW Discharge Note   Patient Details  Name: Miranda Graham MRN: [id number] Date of Birth: 18-Dec-1955  [facility] CM/SW Contact:  Lennart Pall, LCSW Phone Number: 04/03/2021, 10:07 AM   Clinical Narrative:    Met briefly with pt and confirming she has all needed DME at home.  Plan HEP.  No TOC needs.   Final next level of care: Home/Self Care Barriers to Discharge: No Barriers Identified   Patient Goals and CMS Choice Patient states their goals for this hospitalization and ongoing recovery are:: return home      Discharge Placement                       Discharge Plan and Services                DME Arranged: N/A DME Agency: NA                  Social Determinants of Health (SDOH) Interventions     Readmission Risk Interventions No flowsheet data found.

## 2021-04-03 NOTE — Progress Notes (Signed)
   Subjective: 1 Day Post-Op Procedure(s) (LRB): TOTAL HIP ARTHROPLASTY ANTERIOR APPROACH (Right) Patient reports pain as mild.   Patient seen in rounds with Dr. Alvan Dame. Patient is in good spirits this morning. She reports that she feels she is doing very well so far. She states she has been up walking. Foley catheter removed.  We will start therapy today.   Objective: Vital signs in last 24 hours: Temp:  [97.2 F (36.2 C)-98.8 F (37.1 C)] 98.8 F (37.1 C) (08/30 2118) Pulse Rate:  [70-95] 77 (08/31 0554) Resp:  [14-20] 18 (08/31 0554) BP: (101-172)/(61-89) 115/61 (08/31 0554) SpO2:  [93 %-99 %] 93 % (08/31 0554) Weight:  [79.4 kg] 79.4 kg (08/30 2309)  Intake/Output from previous day:  Intake/Output Summary (Last 24 hours) at 04/03/2021 0742 Last data filed at 04/03/2021 0650 Gross per 24 hour  Intake 2642.55 ml  Output 1580 ml  Net 1062.55 ml     Intake/Output this shift: No intake/output data recorded.  Labs: Recent Labs    04/03/21 0329  HGB 7.9*   Recent Labs    04/03/21 0329  WBC 9.3  RBC 2.95*  HCT 26.0*  PLT 264   Recent Labs    04/03/21 0329  NA 143  K 4.0  CL 107  CO2 28  BUN 17  CREATININE 0.77  GLUCOSE 114*  CALCIUM 8.7*   No results for input(s): LABPT, INR in the last 72 hours.  Exam: General - Patient is Alert and Oriented Extremity - Neurologically intact Sensation intact distally Intact pulses distally Dorsiflexion/Plantar flexion intact Dressing - dressing C/D/I Motor Function - intact, moving foot and toes well on exam.   Past Medical History:  Diagnosis Date   Anemia    Arthritis    GERD (gastroesophageal reflux disease)    History of kidney stones    Ovarian cancer (HCC)     Assessment/Plan: 1 Day Post-Op Procedure(s) (LRB): TOTAL HIP ARTHROPLASTY ANTERIOR APPROACH (Right) Active Problems:   S/P right total hip arthroplasty  Estimated body mass index is 32.02 kg/m as calculated from the following:   Height as of  this encounter: '5\' 2"'$  (1.575 m).   Weight as of this encounter: 79.4 kg. Advance diet Up with therapy D/C IV fluids  DVT Prophylaxis - Aspirin Weight bearing as tolerated.  ABLA in setting of chronic anemia - Hgb 7.9 this AM, down from 10.7 pre-operatively  Plan is to go Home after hospital stay. Up with PT today. Plan for discharge home after 1-2 sessions of therapy as long as she is meeting her goals.   Griffith Citron, PA-C Orthopedic Surgery [phone] 04/03/2021, 7:42 AM

## 2021-04-04 NOTE — Discharge Summary (Signed)
Physician Discharge Summary   Patient ID: Miranda Graham MRN: [id number] DOB/AGE: 1956/07/31 65 y.o.  Admit date: 04/02/2021 Discharge date: 04/03/2021  Primary Diagnosis:  Right hip osteoarthritis.   Admission Diagnoses:  Past Medical History:  Diagnosis Date   Anemia    Arthritis    GERD (gastroesophageal reflux disease)    History of kidney stones    Ovarian cancer [facility])    Discharge Diagnoses:   Active Problems:   S/P right total hip arthroplasty  Estimated body mass index is 32.02 kg/m as calculated from the following:   Height as of this encounter: '5\' 2"'$  (1.575 m).   Weight as of this encounter: 79.4 kg.  Procedure:  Procedure(s) (LRB): TOTAL HIP ARTHROPLASTY ANTERIOR APPROACH (Right)   Consults: none  HPI:  Miranda Graham is a 65 y.o. female who had   presented to office for evaluation of right hip pain.  Radiographs revealed   progressive degenerative changes with bone-on-bone   articulation of the  hip joint, including subchondral cystic changes and osteophytes.  The patient had painful limited range of   motion significantly affecting their overall quality of life and function.  The patient was failing to    respond to conservative measures including medications and/or injections and activity modification and at this point was ready   to proceed with more definitive measures.  Consent was obtained for   benefit of pain relief.  Specific risks of infection, DVT, component   failure, dislocation, neurovascular injury, and need for revision surgery were reviewed in the office as well discussion of   the anterior versus posterior approach were reviewed.   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  Laboratory Data: Admission on 04/02/2021, Discharged on 04/03/2021  Component Date Value Ref Range Status   ABO/RH(D) 04/02/2021    Final                   Value:A POS Performed at [facility] [address], [place], [place] [address]    WBC 04/03/2021 9.3  4.0 - 10.5 K/uL Final   RBC 04/03/2021 2.95 (A) 3.87 - 5.11 MIL/uL Final   Hemoglobin 04/03/2021 7.9 (A) 12.0 - 15.0 g/dL Final   HCT 04/03/2021 26.0 (A) 36.0 - 46.0 % Final   MCV 04/03/2021 88.1  80.0 - 100.0 fL Final   MCH 04/03/2021 26.8  26.0 - 34.0 pg Final   MCHC 04/03/2021 30.4  30.0 - 36.0 g/dL Final   RDW 04/03/2021 16.3 (A) 11.5 - 15.5 % Final   Platelets 04/03/2021 264  150 - 400 K/uL Final   nRBC 04/03/2021 0.0  0.0 - 0.2 % Final   Performed at [facility], [place] [address], [place], [place] [address]   Sodium 04/03/2021 143  135 - 145 mmol/L Final   Potassium 04/03/2021 4.0  3.5 - 5.1 mmol/L Final   Chloride 04/03/2021 107  98 - 111 mmol/L Final   CO2 04/03/2021 28  22 - 32 mmol/L Final   Glucose, Bld 04/03/2021 114 (A) 70 - 99 mg/dL Final   Glucose reference range applies only to samples taken after fasting for at least 8 hours.   BUN 04/03/2021 17  8 - 23 mg/dL Final   Creatinine, Ser 04/03/2021 0.77  0.44 - 1.00 mg/dL Final   Calcium 04/03/2021 8.7 (A) 8.9 - 10.3 mg/dL Final   GFR, Estimated 04/03/2021 >60  >60 mL/min Final   Comment: (  NOTE) Calculated using the CKD-EPI Creatinine Equation (2021)    Anion gap 04/03/2021 8  5 - 15 Final   Performed at [facility], [place] [address], [place], [place] [address]  Orders Only on 03/29/2021  Component Date Value Ref Range Status   SARS Coronavirus 2 03/29/2021 RESULT: NEGATIVE   Final   Comment: RESULT: NEGATIVESARS-CoV-2 INTERPRETATION:A NEGATIVE  test result means that SARS-CoV-2 RNA was not present in the specimen above the limit of detection of this test. This does not preclude a possible SARS-CoV-2 infection and should not be used as the  sole basis for patient management decisions. Negative results must be combined with clinical observations, patient history, and epidemiological information. Optimum specimen types and  timing for peak viral levels during infections caused by SARS-CoV-2  have not been determined. Collection of multiple specimens or types of specimens may be necessary to detect virus. Improper specimen collection and handling, sequence variability under primers/probes, or organism present below the limit of detection may  lead to false negative results. Positive and negative predictive values of testing are highly dependent on prevalence. False negative test results are more likely when prevalence of disease is high.The expected result is NEGATIVE.Fact S                          heet for  Healthcare Providers: LocalChronicle.no Sheet for Patients: SalonLookup.es Reference Range - Negative   Hospital Outpatient Visit on 03/20/2021  Component Date Value Ref Range Status   MRSA, PCR 03/20/2021 NEGATIVE  NEGATIVE Final   Staphylococcus aureus 03/20/2021 POSITIVE (A) NEGATIVE Final   Comment: (NOTE) The Xpert SA Assay (FDA approved for NASAL specimens in patients 65 years of age and older), is one component of a comprehensive surveillance program. It is not intended to diagnose infection nor to guide or monitor treatment. Performed at [facility], [place] [address] [place], [place] [address]    WBC 03/20/2021 7.6  4.0 - 10.5 K/uL Final   RBC 03/20/2021 4.02  3.87 - 5.11 MIL/uL Final   Hemoglobin 03/20/2021 10.7 (A) 12.0 - 15.0 g/dL Final   HCT 03/20/2021 35.9 (A) 36.0 - 46.0 % Final   MCV 03/20/2021 89.3  80.0 - 100.0 fL Final   MCH 03/20/2021 26.6  26.0 - 34.0 pg Final   MCHC 03/20/2021 29.8 (A) 30.0 - 36.0 g/dL Final   RDW 03/20/2021 17.2 (A) 11.5 - 15.5 % Final   Platelets 03/20/2021 376  150 - 400 K/uL Final   nRBC 03/20/2021 0.0  0.0 - 0.2 % Final   Performed at [facility], [place] [address], [place], [place] [address]   Sodium 03/20/2021 142  135 - 145 mmol/L Final   Potassium 03/20/2021  4.7  3.5 - 5.1 mmol/L Final   Chloride 03/20/2021 105  98 - 111 mmol/L Final   CO2 03/20/2021 27  22 - 32 mmol/L Final   Glucose, Bld 03/20/2021 73  70 - 99 mg/dL Final   Glucose reference range applies only to samples taken after fasting for at least 8 hours.   BUN 03/20/2021 19  8 - 23 mg/dL Final   Creatinine, Ser 03/20/2021 0.90  0.44 - 1.00 mg/dL Final   Calcium 03/20/2021 9.4  8.9 - 10.3 mg/dL Final   Total Protein 03/20/2021 7.3  6.5 - 8.1 g/dL Final   Albumin 03/20/2021 4.0  3.5 - 5.0 g/dL Final   AST 03/20/2021 17  15 -  41 U/L Final   ALT 03/20/2021 17  0 - 44 U/L Final   Alkaline Phosphatase 03/20/2021 77  38 - 126 U/L Final   Total Bilirubin 03/20/2021 0.5  0.3 - 1.2 mg/dL Final   GFR, Estimated 03/20/2021 >60  >60 mL/min Final   Comment: (NOTE) Calculated using the CKD-EPI Creatinine Equation (2021)    Anion gap 03/20/2021 10  5 - 15 Final   Performed at [facility], [place] [address], [place], [place] [address]   ABO/RH(D) 03/20/2021 A POS   Final   Antibody Screen 03/20/2021 NEG   Final   Sample Expiration 03/20/2021 04/03/2021,2359   Final   Extend sample reason 03/20/2021    Final                   Value:NO TRANSFUSIONS OR PREGNANCY IN THE PAST 3 MONTHS Performed at [facility] [address], [place], [place] [address]      X-Rays:DG Pelvis Portable  Result Date: 04/02/2021 CLINICAL DATA:  Right hip arthroplasty EXAM: PORTABLE PELVIS 1-2 VIEWS COMPARISON:  05/18/2020 FINDINGS: Interval postsurgical changes from right hip arthroplasty. Arthroplasty components are in their expected alignment. No periprosthetic fracture or evidence of other complication. Expected postoperative changes within the overlying soft tissues. Patient is status post prior left hip arthroplasty without apparent complication. IMPRESSION: Interval postsurgical changes from right hip arthroplasty without apparent complication. Electronically Signed   By: Davina Poke D.O.   On: 04/02/2021 13:34   DG C-Arm 1-60 Min-No Report  Result Date: 04/02/2021 Fluoroscopy was utilized by the requesting physician.  No radiographic interpretation.   DG HIP OPERATIVE UNILAT W OR W/O PELVIS RIGHT  Result Date: 04/02/2021 CLINICAL DATA:  Right hip replacement EXAM: OPERATIVE RIGHT HIP WITH PELVIS COMPARISON:  None FLUOROSCOPY TIME:  Radiation Exposure Index (as provided by the fluoroscopic device): 1.67 mGy If the device does not provide the exposure index: Fluoroscopy Time:  Seconds Number of Acquired Images:  2 FINDINGS: Right hip prosthesis is now seen in satisfactory position. No acute bony or soft tissue abnormality is noted. Old left hip prosthesis is noted. IMPRESSION: Status post right hip replacement. Electronically Signed   By: Inez Catalina M.D.   On: 04/02/2021 12:28    EKG: Orders placed or performed during the hospital encounter of 03/20/21   EKG 12 lead per protocol   EKG 12 lead per protocol     Hospital Course: Miranda Graham is a 65 y.o. who was admitted to [facility]. They were brought to the operating room on 04/02/2021 and underwent Procedure(s): [place].  Patient tolerated the procedure well and was later transferred to the recovery room and then to the orthopaedic floor for postoperative care. They were given PO and IV analgesics for pain control following their surgery. They were given 24 hours of postoperative antibiotics of  Anti-infectives (From admission, onward)    Start     Dose/Rate Route Frequency Ordered Stop   04/02/21 1800  ceFAZolin (ANCEF) IVPB 2g/100 mL premix        2 g 200 mL/hr over 30 Minutes Intravenous Every 6 hours 04/02/21 1449 04/03/21 0021   04/02/21 0845  ceFAZolin (ANCEF) IVPB 2g/100 mL premix        2 g 200 mL/hr over 30 Minutes Intravenous On call to O.R. 04/02/21 [id number] 04/02/21 1116      and started on DVT prophylaxis in the form of Aspirin.   PT  and OT were  ordered for total joint protocol. Discharge planning consulted to help with postop disposition and equipment needs.  Patient had a good night on the evening of surgery. They started to get up OOB with therapy on POD #0. Pt was seen during rounds and was ready to go home pending progress with therapy.She worked with therapy on POD #1 and was meeting her goals. Pt was discharged to home later that day in stable condition.  Diet: Regular diet Activity: WBAT Follow-up: in 2 weeks Disposition: Home Discharged Condition: good   Discharge Instructions     Call MD / Call 911   Complete by: As directed    If you experience chest pain or shortness of breath, CALL 911 and be transported to the hospital emergency room.  If you develope a fever above 101 F, pus (white drainage) or increased drainage or redness at the wound, or calf pain, call your surgeon's office.   Change dressing   Complete by: As directed    Maintain surgical dressing until follow up in the clinic. If the edges start to pull up, may reinforce with tape. If the dressing is no longer working, may remove and cover with gauze and tape, but must keep the area dry and clean.  Call with any questions or concerns.   Constipation Prevention   Complete by: As directed    Drink plenty of fluids.  Prune juice may be helpful.  You may use a stool softener, such as Colace (over the counter) 100 mg twice a day.  Use MiraLax (over the counter) for constipation as needed.   Diet - low sodium heart healthy   Complete by: As directed    Increase activity slowly as tolerated   Complete by: As directed    Weight bearing as tolerated with assist device (walker, cane, etc) as directed, use it as long as suggested by your surgeon or therapist, typically at least 4-6 weeks.   Post-operative opioid taper instructions:   Complete by: As directed    POST-OPERATIVE OPIOID TAPER INSTRUCTIONS: It is important to wean off of your opioid medication as soon as  possible. If you do not need pain medication after your surgery it is ok to stop day one. Opioids include: Codeine, Hydrocodone(Norco, Vicodin), Oxycodone(Percocet, oxycontin) and hydromorphone amongst others.  Long term and even short term use of opiods can cause: Increased pain response Dependence Constipation Depression Respiratory depression And more.  Withdrawal symptoms can include Flu like symptoms Nausea, vomiting And more Techniques to manage these symptoms Hydrate well Eat regular healthy meals Stay active Use relaxation techniques(deep breathing, meditating, yoga) Do Not substitute Alcohol to help with tapering If you have been on opioids for less than two weeks and do not have pain than it is ok to stop all together.  Plan to wean off of opioids This plan should start within one week post op of your joint replacement. Maintain the same interval or time between taking each dose and first decrease the dose.  Cut the total daily intake of opioids by one tablet each day Next start to increase the time between doses. The last dose that should be eliminated is the evening dose.      TED hose   Complete by: As directed    Use stockings (TED hose) for 2 weeks on both leg(s).  You may remove them at night for sleeping.      Allergies as of 04/03/2021       Reactions  Shellfish Allergy Itching, Swelling, Other (See Comments)   Facial swelling   Levofloxacin Other (See Comments)   Muscle Pain Pain in calves, muscle        Medication List     STOP taking these medications    traMADol 50 MG tablet Commonly known as: ULTRAM       TAKE these medications    aspirin 81 MG chewable tablet Chew 1 tablet (81 mg total) by mouth 2 (two) times daily for 28 days.   docusate sodium 100 MG capsule Commonly known as: COLACE Take 1 capsule (100 mg total) by mouth 2 (two) times daily.   ferrous sulfate 325 (65 FE) MG tablet Take 1 tablet (325 mg total) by mouth 3  (three) times daily after meals for 14 days. What changed: when to take this   furosemide 20 MG tablet Commonly known as: LASIX Take 20 mg by mouth in the morning.   gabapentin 100 MG capsule Commonly known as: NEURONTIN Take 100 mg by mouth in the morning.   HYDROcodone-acetaminophen 7.5-325 MG tablet Commonly known as: NORCO Take 1-2 tablets by mouth every 6 (six) hours as needed for severe pain (pain score 7-10).   methocarbamol 500 MG tablet Commonly known as: ROBAXIN Take 1 tablet (500 mg total) by mouth every 6 (six) hours as needed for muscle spasms.   methylphenidate 20 MG tablet Commonly known as: RITALIN Take 20 mg by mouth 5 (five) times daily as needed (attention/focus).   multivitamin with minerals Tabs tablet Take 1 tablet by mouth in the morning.   polyethylene glycol 17 g packet Commonly known as: MIRALAX / GLYCOLAX Take 17 g by mouth daily as needed for mild constipation.   potassium chloride 10 MEQ tablet Commonly known as: KLOR-CON Take 10 mEq by mouth in the morning.               Discharge Care Instructions  (From admission, onward)           Start     Ordered   04/03/21 0000  Change dressing       Comments: Maintain surgical dressing until follow up in the clinic. If the edges start to pull up, may reinforce with tape. If the dressing is no longer working, may remove and cover with gauze and tape, but must keep the area dry and clean.  Call with any questions or concerns.   04/03/21 [id number]            Follow-up Information     Paralee Cancel, MD Follow up.   Specialty: Orthopedic Surgery Contact information: [address] [place] [address] [id number]                 Signed: Griffith Citron, PA-C Orthopedic Surgery 04/04/2021, 2:20 PM

## 2021-04-21 ENCOUNTER — Emergency Department (HOSPITAL_COMMUNITY): Payer: Medicare Other

## 2021-04-21 ENCOUNTER — Emergency Department (HOSPITAL_COMMUNITY)
Admission: EM | Admit: 2021-04-21 | Discharge: 2021-04-21 | Disposition: A | Discharge status: Home / Self Care | Payer: Medicare Other | Attending: Emergency Medicine | Admitting: Emergency Medicine

## 2021-04-21 ENCOUNTER — Encounter (HOSPITAL_COMMUNITY): Payer: Self-pay

## 2021-04-21 ENCOUNTER — Other Ambulatory Visit: Payer: Self-pay

## 2021-04-21 DIAGNOSIS — Z7982 Long term (current) use of aspirin: Secondary | ICD-10-CM | POA: Diagnosis not present

## 2021-04-21 DIAGNOSIS — Z8543 Personal history of malignant neoplasm of ovary: Secondary | ICD-10-CM | POA: Diagnosis not present

## 2021-04-21 DIAGNOSIS — Y831 Surgical operation with implant of artificial internal device as the cause of abnormal reaction of the patient, or of later complication, without mention of misadventure at the time of the procedure: Secondary | ICD-10-CM | POA: Insufficient documentation

## 2021-04-21 DIAGNOSIS — Z87891 Personal history of nicotine dependence: Secondary | ICD-10-CM | POA: Diagnosis not present

## 2021-04-21 DIAGNOSIS — M25552 Pain in left hip: Secondary | ICD-10-CM | POA: Insufficient documentation

## 2021-04-21 DIAGNOSIS — T84021A Dislocation of internal left hip prosthesis, initial encounter: Secondary | ICD-10-CM | POA: Diagnosis present

## 2021-04-21 DIAGNOSIS — S73005A Unspecified dislocation of left hip, initial encounter: Secondary | ICD-10-CM

## 2021-04-21 MED ORDER — FENTANYL CITRATE PF 50 MCG/ML IJ SOSY
50.0000 ug | PREFILLED_SYRINGE | Freq: Once | INTRAMUSCULAR | Status: AC
  Administered 2021-04-21: 50 ug via INTRAVENOUS
  Filled 2021-04-21: qty 1

## 2021-04-21 MED ORDER — HYDROMORPHONE HCL 1 MG/ML IJ SOLN
1.0000 mg | Freq: Once | INTRAMUSCULAR | Status: AC
  Administered 2021-04-21: 1 mg via INTRAVENOUS
  Filled 2021-04-21: qty 1

## 2021-04-21 MED ORDER — SODIUM CHLORIDE 0.9 % IV SOLN
INTRAVENOUS | Status: AC | PRN
  Administered 2021-04-21: 10 mL/h via INTRAVENOUS

## 2021-04-21 MED ORDER — PROPOFOL 10 MG/ML IV BOLUS
INTRAVENOUS | Status: AC | PRN
  Administered 2021-04-21 (×3): 20 mg via INTRAVENOUS

## 2021-04-21 MED ORDER — PROPOFOL 10 MG/ML IV BOLUS
0.5000 mg/kg | Freq: Once | INTRAVENOUS | Status: AC
  Administered 2021-04-21: 40.2 mg via INTRAVENOUS
  Filled 2021-04-21: qty 20

## 2021-04-21 MED ORDER — HYDROMORPHONE HCL 1 MG/ML IJ SOLN
1.0000 mg | Freq: Once | INTRAMUSCULAR | Status: AC
Start: 2021-04-21 — End: 2021-04-21
  Administered 2021-04-21: 1 mg via INTRAVENOUS
  Filled 2021-04-21: qty 1

## 2021-04-21 NOTE — ED Triage Notes (Signed)
Pt arrives via [facility] for possible dislocation to L hip. Pt had R hip replacement approximately three weeks ago, and L hip replacement in Aug 2021. 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 Pt did not fall, but felt a pop in her L hip and called 911. L leg is shortened and outwardly rotated on arrival.   #20 R FA by EMS with 100 mcg fentanyl IV, 300 mL NS BOLUS  EMS last VS - 134/38, HR 83, RR 16, 95% on RA

## 2021-04-21 NOTE — Progress Notes (Signed)
Orthopedic Tech Progress Note Patient Details:  Miranda Graham [birth year] [id number]  Ortho Devices Type of Ortho Device: Knee Immobilizer Ortho Device/Splint Location: Left hip reduction Ortho Device/Splint Interventions: Application   Post Interventions Patient Tolerated: Well Instructions Provided: Care of device  Miranda Graham 04/21/2021, 5:04 PM

## 2021-04-21 NOTE — Discharge Instructions (Addendum)
You came to the emergency department for evaluation of your left hip pain.  Your physical exam and x-ray imaging showed a dislocation of your left hip.  You had reduction performed here in the emergency department.  Your hip was successfully reduced.  You will need to follow-up with the orthopedic provider Dr. Alvan Dame this week.  Please use knee immobilizer until you speak with your orthopedic provider.  Get help right away if you: Feel like your hip has dislocated again. Have severe pain in your hip or groin. Have numbness or weakness in your leg. Cannot move any part of your hip or leg.

## 2021-04-21 NOTE — ED Provider Notes (Addendum)
[name] DEPT Provider Note   CSN: [id number] Arrival date & time: 04/21/21  1352     History Chief Complaint  Patient presents with  . Dislocation    Miranda Graham is a 65 y.o. female with medical history as noted below.  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 Claiborne Billings at [facility] on 04/03/2020.  Since then patient has had 4 episodes of left hip dislocation.  Patient reports that she was told an injury occurred during the hip replacement which makes her prone to replacements.  Patient is now seeing orthopedic provider Dr. Alvan Dame, most recently she had total hip arthroplasty of right hip performed on 04/02/2021.  Patient reports that just prior to arrival Emergency Department today she was sitting on her couch getting ready to move to a standing position when she had a dislocation of her left hip.  Patient complains of pain constantly from then.  Patient rates pain 10/10 on the pain scale.  Pain is worse with movement or touch.  Patient has not tried any modalities to alleviate her symptoms.  Patient denies any numbness or.  No fall or traumatic injury.    HPI     Past Medical History:  Diagnosis Date  . Anemia   . Arthritis   . GERD (gastroesophageal reflux disease)   . History of kidney stones   . Ovarian cancer [facility])     Patient Active Problem List   Diagnosis Date Noted  . 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 ABDOMINAL HYSTERECTOMY    . FINGER SURGERY Right   . HIP ARTHROPLASTY Left   . TOTAL HIP ARTHROPLASTY Right 04/02/2021   Procedure: TOTAL HIP ARTHROPLASTY ANTERIOR APPROACH;  Surgeon: Paralee Cancel, MD;  Location: [facility] ORS;  Service: Orthopedics;  Laterality: Right;     OB History   No obstetric history on file.     No family history on file.  Social History   Tobacco Use  . Smoking status: Former    Packs/day: 1.00    Years: 15.00    Pack years: 15.00     Types: Cigarettes  . Smokeless tobacco: Never  Vaping Use  . Vaping Use: Never used  Substance Use Topics  . Alcohol use: Never  . Drug use: Never    Home Medications Prior to Admission medications   Medication Sig Start Date End Date Taking? Authorizing Provider  aspirin 81 MG chewable tablet Chew 1 tablet (81 mg total) by mouth 2 (two) times daily for 28 days. 04/03/21 05/01/21  Irving Copas, PA-C  docusate sodium (COLACE) 100 MG capsule Take 1 capsule (100 mg total) by mouth 2 (two) times daily. 04/03/21   Irving Copas, PA-C  ferrous sulfate 325 (65 FE) MG tablet Take 1 tablet (325 mg total) by mouth 3 (three) times daily after meals for 14 days. 04/03/21 04/17/21  Irving Copas, PA-C  furosemide (LASIX) 20 MG tablet Take 20 mg by mouth in the morning.    [provider]  gabapentin (NEURONTIN) 100 MG capsule Take 100 mg by mouth in the morning.    [provider]  HYDROcodone-acetaminophen (NORCO) 7.5-325 MG tablet Take 1-2 tablets by mouth every 6 (six) hours as needed for severe pain (pain score 7-10). 04/03/21   Irving Copas, PA-C  methocarbamol (ROBAXIN) 500 MG tablet Take 1 tablet (500 mg total) by mouth every 6 (six) hours as needed for  muscle spasms. 04/03/21   Irving Copas, PA-C  methylphenidate (RITALIN) 20 MG tablet Take 20 mg by mouth 5 (five) times daily as needed (attention/focus).    [provider]  Multiple Vitamin (MULTIVITAMIN WITH MINERALS) TABS tablet Take 1 tablet by mouth in the morning.    [provider]  polyethylene glycol (MIRALAX / GLYCOLAX) 17 g packet Take 17 g by mouth daily as needed for mild constipation. 04/03/21   Irving Copas, PA-C  potassium chloride (KLOR-CON) 10 MEQ tablet Take 10 mEq by mouth in the morning.    [provider]    Allergies    Shellfish allergy and Levofloxacin  Review of Systems   Review of Systems  Constitutional:  Negative for chills and fever.  Eyes:   Negative for visual disturbance.  Respiratory:  Negative for shortness of breath.   Cardiovascular:  Negative for chest pain.  Gastrointestinal:  Negative for abdominal pain, nausea and vomiting.  Musculoskeletal:  Positive for arthralgias and gait problem. Negative for back pain and neck pain.  Skin:  Negative for color change, pallor, rash and wound.  Allergic/Immunologic: Negative for immunocompromised state.  Neurological:  Negative for weakness and numbness.  Hematological:  Does not bruise/bleed easily.  Psychiatric/Behavioral:  Negative for confusion.    Physical Exam Updated Vital Signs BP (!) 112/59 (BP Location: Left Arm)   Pulse 83   Temp 99.3 F (37.4 C)   Resp 20   Ht '5\' 2"'$  (1.575 m)   Wt 80.3 kg   SpO2 95%   BMI 32.37 kg/m   Physical Exam Vitals and nursing note reviewed.  Constitutional:      General: She is not in acute distress.    Appearance: She is not ill-appearing, toxic-appearing or diaphoretic.  HENT:     Head: Normocephalic.  Eyes:     General: No scleral icterus.       Right eye: No discharge.        Left eye: No discharge.  Cardiovascular:     Rate and Rhythm: Normal rate.     Pulses:          Dorsalis pedis pulses are 2+ on the right side and 2+ on the left side.  Pulmonary:     Effort: Pulmonary effort is normal.  Musculoskeletal:     Right ankle: No swelling, deformity, ecchymosis or lacerations. No tenderness.     Left ankle: No swelling, deformity, ecchymosis or lacerations. No tenderness.     Right foot: Normal range of motion and normal capillary refill. No swelling, deformity, laceration, tenderness, bony tenderness or crepitus. Normal pulse.     Left foot: Normal range of motion and normal capillary refill. No swelling, deformity, laceration, tenderness, bony tenderness or crepitus. Normal pulse.     Comments: No midline tenderness or deformity to cervical, thoracic, lumbar spine.  Patient has shortening and external rotation to left  leg.  Tenderness and deformity to left hip.  Pulse, motor, and sensation intact distally.  Skin:    General: Skin is warm and dry.  Neurological:     General: No focal deficit present.     Mental Status: She is alert.  Psychiatric:        Behavior: Behavior is cooperative.    ED Results / Procedures / Treatments   Labs (all labs ordered are listed, but only abnormal results are displayed) Labs Reviewed - No data to display  EKG None  Radiology DG Hip Unilat W or Wo Pelvis  2-3 Views Left  Result Date: 04/21/2021 CLINICAL DATA:  Status post reduction. EXAM: DG HIP (WITH OR WITHOUT PELVIS) 2-3V LEFT COMPARISON:  Left hip x-ray 04/21/2021. FINDINGS: Left hip arthroplasty is now in anatomic alignment. There is a new fracture fragment displaced superior to the greater trochanter measuring 3.0 by 1.5 cm. There is no evidence for hardware loosening. Surgical clips are seen in the pelvis. IMPRESSION: 1. Left hip arthroplasty is now in anatomic alignment. 2. There is a new small free fracture fragment superior to the greater tuberosity. Electronically Signed   By: Ronney Asters M.D.   On: 04/21/2021 17:29   DG Hip Unilat W or Wo Pelvis 2-3 Views Left  Result Date: 04/21/2021 CLINICAL DATA:  Acute LEFT hip pain. Bilateral total hip arthroplasties. EXAM: DG HIP (WITH OR WITHOUT PELVIS) 2-3V LEFT COMPARISON:  05/18/2020 and prior studies. FINDINGS: Bilateral hip arthroplasty changes are noted. There is SUPERIOR LEFT femoral head component dislocation of the LEFT hip arthroplasty. No acute fractures are identified. IMPRESSION: SUPERIOR dislocation of the LEFT femoral head component of the LEFT hip arthroplasty. Electronically Signed   By: Margarette Canada M.D.   On: 04/21/2021 15:27    Procedures Reduction of dislocation  Date/Time: 04/21/2021 5:21 PM Performed by: Loni Beckwith, PA-C Authorized by: Loni Beckwith, PA-C  Consent: Verbal consent obtained. Written consent obtained. Risks and  benefits: risks, benefits and alternatives were discussed Consent given by: patient Patient understanding: patient states understanding of the procedure being performed Patient consent: the patient's understanding of the procedure matches consent given Procedure consent: procedure consent matches procedure scheduled Imaging studies: imaging studies available Required items: required blood products, implants, devices, and special equipment available Patient identity confirmed: verbally with patient and arm band Time out: Immediately prior to procedure a "time out" was called to verify the correct patient, procedure, equipment, support staff and site/side marked as required.  Sedation: Patient sedated: yes Sedation type: moderate (conscious) sedation Sedatives: propofol Sedation start date/time: 04/21/2021 4:55 PM Sedation end date/time: 04/21/2021 5:10 PM  Patient tolerance: patient tolerated the procedure well with no immediate complications Comments: Successful reduction of left hip dislocation.  Procedure was assisted by Dr. Roderic Palau and Dr. Pearline Cables.     Medications Ordered in ED Medications  HYDROmorphone (DILAUDID) injection 1 mg (1 mg Intravenous Given 04/21/21 1433)  HYDROmorphone (DILAUDID) injection 1 mg (1 mg Intravenous Given 04/21/21 1533)  propofol (DIPRIVAN) 10 mg/mL bolus/IV push 40.2 mg (40.2 mg Intravenous Given 04/21/21 1648)  fentaNYL (SUBLIMAZE) injection 50 mcg (50 mcg Intravenous Given 04/21/21 1640)  0.9 %  sodium chloride infusion (10 mL/hr Intravenous New Bag/Given 04/21/21 1640)  propofol (DIPRIVAN) 10 mg/mL bolus/IV push (20 mg Intravenous Given 04/21/21 1655)    ED Course  I have reviewed the triage vital signs and the nursing notes.  Pertinent labs & imaging results that were available during my care of the patient were reviewed by me and considered in my medical decision making (see chart for details).    MDM Rules/Calculators/A&P                            Alert 65 year old female no acute distress, nontoxic-appearing.  Patient appears uncomfortable due to complaints of left hip pain.  Patient had total left hip arthroplasty performed 03/2020 by Dr. Claiborne Billings at [facility].  Since then patient has had 4 episodes of left hip dislocation.  Patient reports that due to complication during surgery she is prone  to left hip dislocations.  Patient reports that she now sees orthopedic provider Dr.Olin.    Of the exam patient has shortening and external rotation to left leg.  Pulse, motor, and sensation intact distally.  We will give patient medication for pain management, obtain x-ray imaging.  Suspect that patient has dislocation of left hip and will need conscious sedation for reduction in the emergency department.  X-ray imaging confirms left hip dislocation.  Conscious sedation was performed and successful reduction of left hip.  Patient tolerated procedure well without any complications  Will consult on-call [facility].  Spoke to Dr. Linda Hedges who recommended knee immobilizer and follow-up with Dr. Alvan Dame this week.  Patient was discussed with and evaluated by Dr. Roderic Palau.  Discussed results, findings, treatment and follow up. Patient advised of return precautions. Patient verbalized understanding and agreed with plan.    Final Clinical Impression(s) / ED Diagnoses Final diagnoses:  None    Rx / DC Orders ED Discharge Orders     None        Dyann Ruddle 04/21/21 1800    Milton Ferguson, MD 04/22/21 1655    [address], PA-C 05/06/21 2110    Milton Ferguson, MD 05/11/21 1045

## 2021-04-29 NOTE — ED Provider Notes (Signed)
Patient complains of left hip pain.  Patient has a history left hip replacement.  Physical exam tender deformed left hip.  Patient was a shared visit with the PA Collene Leyden, MD 04/29/21 1053

## 2021-05-01 NOTE — Patient Instructions (Addendum)
DUE TO COVID-19 ONLY ONE VISITOR IS ALLOWED TO COME WITH YOU AND STAY IN THE WAITING ROOM ONLY DURING PRE OP AND PROCEDURE.   **NO VISITORS ARE ALLOWED IN THE SHORT STAY AREA OR RECOVERY ROOM!!**  IF YOU WILL BE ADMITTED INTO THE HOSPITAL YOU ARE ALLOWED ONLY TWO SUPPORT PEOPLE DURING VISITATION HOURS ONLY (10AM -8PM)   The support person(s) may change daily. The support person(s) must pass our screening, gel in and out, and wear a mask at all times, including in the patient's room. Patients must also wear a mask when staff or their support person are in the room.  No visitors under the age of 21. Any visitor under the age of 39 must be accompanied by an adult.    COVID SWAB TESTING MUST BE COMPLETED ON: 05/08/21  **MUST PRESENT COMPLETED FORM AT TESTING SITE**    [place] (backside of the building) You are not required to quarantine, however you are required to wear a well-fitted mask when you are out and around people not in your household.  Hand Hygiene often Do NOT share personal items Notify your provider if you are in close contact with someone who has COVID or you develop fever 100.4 or greater, new onset of sneezing, cough, sore throat, shortness of breath or body aches.  [place], [address], must go inside of the hospital, NOT A DRIVE THRU!  (Must self quarantine after testing. Follow instructions on handout.)       Your procedure is scheduled on: 05/10/21   Report to [facility] Main Entrance    Report to admitting at : 11:45 AM   Call this number if you have problems the morning of surgery [phone]   Do not eat food :After Midnight.   May have liquids until: 11:30 AM    day of surgery  CLEAR LIQUID DIET  Foods Allowed                                                                     Foods Excluded  Water, Black Coffee and tea, regular and decaf                              liquids that you cannot  Plain Jell-O in any flavor  (No red)                                           see through such as: Fruit ices (not with fruit pulp)                                     milk, soups, orange juice              Iced Popsicles (No red)  All solid food                                   Apple juices Sports drinks like Gatorade (No red) Lightly seasoned clear broth or consume(fat free) Sugar,   Sample Menu Breakfast                                Lunch                                     Supper Cranberry juice                    Beef broth                            Chicken broth Jell-O                                     Grape juice                           Apple juice Coffee or tea                        Jell-O                                      Popsicle                                                Coffee or tea                        Coffee or tea     Oral Hygiene is also important to reduce your risk of infection.                                    Remember - BRUSH YOUR TEETH THE MORNING OF SURGERY WITH YOUR REGULAR TOOTHPASTE   Do NOT smoke after Midnight   Take these medicines the morning of surgery with A SIP OF WATER: methylphenidate(Ritalin)  DO NOT TAKE ANY ORAL DIABETIC MEDICATIONS DAY OF YOUR SURGERY                              You may not have any metal on your body including hair pins, jewelry, and body piercing             Do not wear make-up, lotions, powders, perfumes/cologne, or deodorant  Do not wear nail polish including gel and S&S, artificial/acrylic nails, or any other type of covering on natural nails including finger and toenails. If you have artificial nails, gel coating, etc. that needs to be removed by a nail salon please have this removed prior to surgery or  surgery may need to be canceled/ delayed if the surgeon/ anesthesia feels like they are unable to be safely monitored.   Do not shave   48 hours prior to surgery.    Do not bring valuables to the hospital. [place].   Contacts, dentures or bridgework may not be worn into surgery.   Bring small overnight bag day of surgery.    Patients discharged on the day of surgery will not be allowed to drive home.   Special Instructions: Bring a copy of your healthcare power of attorney and living will documents         the day of surgery if you haven't scanned them before.              Please read over the following fact sheets you were given: IF YOU HAVE QUESTIONS ABOUT YOUR PRE-OP INSTRUCTIONS PLEASE CALL [phone]   [place] [facility] - Preparing for Surgery Before surgery, you can play an important role.  Because skin is not sterile, your skin needs to be as free of germs as possible.  You can reduce the number of germs on your skin by washing with CHG (chlorahexidine gluconate) soap before surgery.  CHG is an antiseptic cleaner which kills germs and bonds with the skin to continue killing germs even after washing. Please DO NOT use if you have an allergy to CHG or antibacterial soaps.  If your skin becomes reddened/irritated stop using the CHG and inform your nurse when you arrive at Short Stay. Do not shave (including legs and underarms) for at least 48 hours prior to the first CHG shower.  You may shave your face/neck. Please follow these instructions carefully:  1.  Shower with CHG Soap the night before surgery and the  morning of Surgery.  2.  If you choose to wash your hair, wash your hair first as usual with your  normal  shampoo.  3.  After you shampoo, rinse your hair and body thoroughly to remove the  shampoo.                           4.  Use CHG as you would any other liquid soap.  You can apply chg directly  to the skin and wash                       Gently with a scrungie or clean washcloth.  5.  Apply the CHG Soap to your body ONLY FROM THE NECK DOWN.   Do not use on face/  open                           Wound or open sores. Avoid contact with eyes, ears mouth and genitals (private parts).                       Wash face,  Genitals (private parts) with your normal soap.             6.  Wash thoroughly, paying special attention to the area where your surgery  will be performed.  7.  Thoroughly rinse your body with warm water from the neck down.  8.  DO NOT shower/wash with your normal soap after using and rinsing off  the CHG Soap.  9.  Pat yourself dry with a clean towel.            10.  Wear clean pajamas.            11.  Place clean sheets on your bed the night of your first shower and do not  sleep with pets. Day of Surgery : Do not apply any lotions/deodorants the morning of surgery.  Please wear clean clothes to the hospital/surgery center.  FAILURE TO FOLLOW THESE INSTRUCTIONS MAY RESULT IN THE CANCELLATION OF YOUR SURGERY PATIENT SIGNATURE_________________________________  NURSE SIGNATURE__________________________________  ________________________________________________________________________

## 2021-05-01 NOTE — Progress Notes (Addendum)
Pt. Needs orders for surgery. PAT: 05/02/21.

## 2021-05-02 ENCOUNTER — Encounter (HOSPITAL_COMMUNITY): Payer: Self-pay

## 2021-05-02 ENCOUNTER — Encounter (HOSPITAL_COMMUNITY)
Admission: RE | Admit: 2021-05-02 | Discharge: 2021-05-02 | Disposition: A | Discharge status: Home / Self Care | Payer: Medicare Other | Source: Ambulatory Visit | Attending: Orthopedic Surgery | Admitting: Orthopedic Surgery

## 2021-05-02 ENCOUNTER — Other Ambulatory Visit: Payer: Self-pay

## 2021-05-02 DIAGNOSIS — M25352 Other instability, left hip: Secondary | ICD-10-CM | POA: Insufficient documentation

## 2021-05-02 DIAGNOSIS — Z87891 Personal history of nicotine dependence: Secondary | ICD-10-CM | POA: Insufficient documentation

## 2021-05-02 DIAGNOSIS — Z79899 Other long term (current) drug therapy: Secondary | ICD-10-CM | POA: Insufficient documentation

## 2021-05-02 DIAGNOSIS — Z01812 Encounter for preprocedural laboratory examination: Secondary | ICD-10-CM | POA: Diagnosis not present

## 2021-05-02 LAB — SURGICAL PCR SCREEN
MRSA, PCR: NEGATIVE
Staphylococcus aureus: POSITIVE — AB

## 2021-05-02 LAB — CBC
HCT: 27.3 % — ABNORMAL LOW (ref 36.0–46.0)
Hemoglobin: 8.2 g/dL — ABNORMAL LOW (ref 12.0–15.0)
MCH: 27.3 pg (ref 26.0–34.0)
MCHC: 30 g/dL (ref 30.0–36.0)
MCV: 91 fL (ref 80.0–100.0)
Platelets: 289 10*3/uL (ref 150–400)
RBC: 3 MIL/uL — ABNORMAL LOW (ref 3.87–5.11)
RDW: 17.8 % — ABNORMAL HIGH (ref 11.5–15.5)
WBC: 8.2 10*3/uL (ref 4.0–10.5)
nRBC: 0 % (ref 0.0–0.2)

## 2021-05-02 LAB — BASIC METABOLIC PANEL
Anion gap: 10 (ref 5–15)
BUN: 27 mg/dL — ABNORMAL HIGH (ref 8–23)
CO2: 29 mmol/L (ref 22–32)
Calcium: 8.7 mg/dL — ABNORMAL LOW (ref 8.9–10.3)
Chloride: 101 mmol/L (ref 98–111)
Creatinine, Ser: 1.05 mg/dL — ABNORMAL HIGH (ref 0.44–1.00)
GFR, Estimated: 59 mL/min — ABNORMAL LOW (ref >60)
Glucose, Bld: 91 mg/dL (ref 70–99)
Potassium: 2.9 mmol/L — ABNORMAL LOW (ref 3.5–5.1)
Sodium: 140 mmol/L (ref 135–145)

## 2021-05-02 NOTE — Progress Notes (Signed)
Lab. Results: PCR: positive [facility] Potassium: 2.9 Hemoglobin: 8.2

## 2021-05-02 NOTE — Progress Notes (Signed)
COVID Vaccine Completed: Yes Date COVID Vaccine completed: 10/19/19. X 2 COVID vaccine manufacturer:   Pricilla Handler COVID Test: 05/08/21  PCP - Dr. Noel Journey Cardiologist -   Chest x-ray - 08/30/20 [facility] EKG - 03/20/21 EPIC Stress Test -  ECHO -  Cardiac Cath -  Pacemaker/ICD device last checked:  Sleep Study -  CPAP -   Fasting Blood Sugar -  Checks Blood Sugar _____ times a day  Blood Thinner Instructions: Aspirin Instructions: Last Dose:  Anesthesia review:   Patient denies shortness of breath, fever, cough and chest pain at PAT appointment   Patient verbalized understanding of instructions that were given to them at the PAT appointment. Patient was also instructed that they will need to review over the PAT instructions again at home before surgery.

## 2021-05-08 ENCOUNTER — Other Ambulatory Visit: Payer: Self-pay | Admitting: Orthopedic Surgery

## 2021-05-08 LAB — SARS CORONAVIRUS 2 (TAT 6-24 HRS): SARS Coronavirus 2: NEGATIVE

## 2021-05-08 NOTE — Anesthesia Preprocedure Evaluation (Addendum)
Anesthesia Evaluation  Patient identified by MRN, date of birth, ID band Patient awake    Reviewed: Allergy & Precautions, NPO status , Patient's Chart, lab work & pertinent test results  Airway Mallampati: II  TM Distance: >3 FB Neck ROM: Full    Dental no notable dental hx.    Pulmonary neg pulmonary ROS, former smoker,    Pulmonary exam normal breath sounds clear to auscultation       Cardiovascular negative cardio ROS Normal cardiovascular exam Rhythm:Regular Rate:Normal     Neuro/Psych negative neurological ROS  negative psych ROS   GI/Hepatic Neg liver ROS, GERD  ,  Endo/Other  negative endocrine ROS  Renal/GU negative Renal ROS  negative genitourinary   Musculoskeletal  (+) Arthritis ,   Abdominal   Peds negative pediatric ROS (+)  Hematology negative hematology ROS (+) anemia ,   Anesthesia Other Findings   Reproductive/Obstetrics negative OB ROS                            Anesthesia Physical Anesthesia Plan  ASA: 3  Anesthesia Plan: Spinal   Post-op Pain Management:    Induction: Intravenous  PONV Risk Score and Plan: 2 and Ondansetron, Dexamethasone, Propofol infusion and Treatment may vary due to age or medical condition  Airway Management Planned: Simple Face Mask  Additional Equipment:   Intra-op Plan:   Post-operative Plan:   Informed Consent: I have reviewed the patients History and Physical, chart, labs and discussed the procedure including the risks, benefits and alternatives for the proposed anesthesia with the patient or authorized representative who has indicated his/her understanding and acceptance.     Dental advisory given  Plan Discussed with: CRNA and Surgeon  Anesthesia Plan Comments: (See PAT note 05/02/2021, Konrad Felix Ward, PA-C)       Anesthesia Quick Evaluation

## 2021-05-08 NOTE — Progress Notes (Signed)
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   Pt last seen by cardiology 03/28/2021. Per OV note, "preop for hip surgery patient is cleared is an acceptable risk"  S/p right hip total hip arthroplasty 04/02/2021. No anesthesia complications noted.   Hemoglobin 10.7 at the time of right total hip.  Hemoglobin now 8.2.  Dr. Aurea Graff office made aware.  VS: BP 133/62   Pulse 80   Temp 36.7 C (Oral)   Resp 18   Ht 5\' 2"  (1.575 m)   Wt 88.2 kg   SpO2 99%   BMI 35.55 kg/m   PROVIDERS: Miranda Journey, MD is PCP   Miranda Amel, MD is Cardiologist  LABS:  Hemoglobin discussed with Dr. Aurea Graff office (all labs ordered are listed, but only abnormal results are displayed)  Labs Reviewed  SURGICAL PCR SCREEN - Abnormal; Notable for the following components:      Result Value   Staphylococcus aureus POSITIVE (*)    All other components within normal limits  CBC - Abnormal; Notable for the following components:   RBC 3.00 (*)    Hemoglobin 8.2 (*)    HCT 27.3 (*)    RDW 17.8 (*)    All other components within normal limits  BASIC METABOLIC PANEL - Abnormal; Notable for the following components:   Potassium 2.9 (*)    BUN 27 (*)    Creatinine, Ser 1.05 (*)    Calcium 8.7 (*)    GFR, Estimated 59 (*)    All other components within normal limits     IMAGES:   EKG: 03/20/2021 Rate 82 bpm  Normal sinus rhythm Left anterior fascicular block Moderate voltage criteria for LVH, may be normal variant ( R in aVL , Cornell product ) Septal infarct , age  undetermined Possible Lateral infarct , age undetermined Abnormal ECG No previous tracing  CV:  Past Medical History:  Diagnosis Date   Anemia    Arthritis    GERD (gastroesophageal reflux disease)    History of kidney stones    Ovarian cancer ([place])     Past Surgical History:  Procedure Laterality Date   ABDOMINAL HYSTERECTOMY     FINGER SURGERY Right    HIP ARTHROPLASTY Left    TOTAL HIP ARTHROPLASTY Right 04/02/2021   Procedure: TOTAL HIP ARTHROPLASTY ANTERIOR APPROACH;  Surgeon: Paralee Cancel, MD;  Location: [facility];  Service: Orthopedics;  Laterality: Right;    MEDICATIONS:  docusate sodium (COLACE) 100 MG capsule   ferrous sulfate 325 (65 FE) MG tablet   furosemide (LASIX) 20 MG tablet   HYDROcodone-acetaminophen (NORCO) 7.5-325 MG tablet   methocarbamol (ROBAXIN) 500 MG tablet   methylphenidate (RITALIN) 20 MG tablet   Multiple Vitamin (MULTIVITAMIN WITH MINERALS) TABS tablet   polyethylene glycol (MIRALAX / GLYCOLAX) 17 g packet   potassium chloride (KLOR-CON) 10 MEQ tablet   No current facility-administered medications for this encounter.     Miranda Felix Ward, PA-C [facility] [phone]

## 2021-05-10 ENCOUNTER — Inpatient Hospital Stay (HOSPITAL_COMMUNITY): Payer: Medicare Other

## 2021-05-10 ENCOUNTER — Inpatient Hospital Stay (HOSPITAL_COMMUNITY)
Admission: RE | Admit: 2021-05-10 | Discharge: 2021-05-15 | DRG: 466 | Disposition: A | Discharge status: Home / Self Care | Payer: Medicare Other | Source: Ambulatory Visit | Attending: Orthopedic Surgery | Admitting: Orthopedic Surgery

## 2021-05-10 ENCOUNTER — Inpatient Hospital Stay (HOSPITAL_COMMUNITY): Payer: Medicare Other | Admitting: Physician Assistant

## 2021-05-10 ENCOUNTER — Encounter (HOSPITAL_COMMUNITY)
Admission: RE | Disposition: A | Discharge status: Home / Self Care | Payer: Self-pay | Source: Ambulatory Visit | Attending: Orthopedic Surgery

## 2021-05-10 ENCOUNTER — Inpatient Hospital Stay (HOSPITAL_COMMUNITY): Payer: Medicare Other | Admitting: Anesthesiology

## 2021-05-10 DIAGNOSIS — D62 Acute posthemorrhagic anemia: Secondary | ICD-10-CM | POA: Diagnosis not present

## 2021-05-10 DIAGNOSIS — Z87891 Personal history of nicotine dependence: Secondary | ICD-10-CM | POA: Diagnosis not present

## 2021-05-10 DIAGNOSIS — T84021A Dislocation of internal left hip prosthesis, initial encounter: Secondary | ICD-10-CM | POA: Diagnosis present

## 2021-05-10 DIAGNOSIS — M9702XA Periprosthetic fracture around internal prosthetic left hip joint, initial encounter: Secondary | ICD-10-CM | POA: Diagnosis present

## 2021-05-10 DIAGNOSIS — Z9109 Other allergy status, other than to drugs and biological substances: Secondary | ICD-10-CM | POA: Diagnosis not present

## 2021-05-10 DIAGNOSIS — Z96641 Presence of right artificial hip joint: Secondary | ICD-10-CM | POA: Diagnosis present

## 2021-05-10 DIAGNOSIS — Y792 Prosthetic and other implants, materials and accessory orthopedic devices associated with adverse incidents: Secondary | ICD-10-CM | POA: Diagnosis present

## 2021-05-10 DIAGNOSIS — Z91013 Allergy to seafood: Secondary | ICD-10-CM

## 2021-05-10 DIAGNOSIS — Z96649 Presence of unspecified artificial hip joint: Secondary | ICD-10-CM

## 2021-05-10 DIAGNOSIS — Z881 Allergy status to other antibiotic agents status: Secondary | ICD-10-CM | POA: Diagnosis not present

## 2021-05-10 DIAGNOSIS — Z419 Encounter for procedure for purposes other than remedying health state, unspecified: Secondary | ICD-10-CM

## 2021-05-10 DIAGNOSIS — Z8543 Personal history of malignant neoplasm of ovary: Secondary | ICD-10-CM | POA: Diagnosis not present

## 2021-05-10 DIAGNOSIS — Z96642 Presence of left artificial hip joint: Principal | ICD-10-CM

## 2021-05-10 DIAGNOSIS — S72112A Displaced fracture of greater trochanter of left femur, initial encounter for closed fracture: Secondary | ICD-10-CM | POA: Diagnosis present

## 2021-05-10 DIAGNOSIS — M1612 Unilateral primary osteoarthritis, left hip: Secondary | ICD-10-CM | POA: Diagnosis present

## 2021-05-10 HISTORY — PX: TOTAL HIP REVISION: SHX763

## 2021-05-10 LAB — CBC
HCT: 29.1 % — ABNORMAL LOW (ref 36.0–46.0)
Hemoglobin: 8.6 g/dL — ABNORMAL LOW (ref 12.0–15.0)
MCH: 26.8 pg (ref 26.0–34.0)
MCHC: 29.6 g/dL — ABNORMAL LOW (ref 30.0–36.0)
MCV: 90.7 fL (ref 80.0–100.0)
Platelets: 172 10*3/uL (ref 150–400)
RBC: 3.21 MIL/uL — ABNORMAL LOW (ref 3.87–5.11)
RDW: 17.8 % — ABNORMAL HIGH (ref 11.5–15.5)
WBC: 8.1 10*3/uL (ref 4.0–10.5)
nRBC: 0 % (ref 0.0–0.2)

## 2021-05-10 LAB — COMPREHENSIVE METABOLIC PANEL
ALT: 13 U/L (ref 0–44)
AST: 33 U/L (ref 15–41)
Albumin: 3.6 g/dL (ref 3.5–5.0)
Alkaline Phosphatase: 77 U/L (ref 38–126)
Anion gap: 11 (ref 5–15)
BUN: 20 mg/dL (ref 8–23)
CO2: 27 mmol/L (ref 22–32)
Calcium: 9 mg/dL (ref 8.9–10.3)
Chloride: 100 mmol/L (ref 98–111)
Creatinine, Ser: 0.85 mg/dL (ref 0.44–1.00)
GFR, Estimated: >60 mL/min (ref >60)
Glucose, Bld: 83 mg/dL (ref 70–99)
Potassium: 3.7 mmol/L (ref 3.5–5.1)
Sodium: 138 mmol/L (ref 135–145)
Total Bilirubin: 0.6 mg/dL (ref 0.3–1.2)
Total Protein: 7 g/dL (ref 6.5–8.1)

## 2021-05-10 LAB — HEMOGLOBIN AND HEMATOCRIT, BLOOD
HCT: 24.3 % — ABNORMAL LOW (ref 36.0–46.0)
Hemoglobin: 7.2 g/dL — ABNORMAL LOW (ref 12.0–15.0)

## 2021-05-10 LAB — PREPARE RBC (CROSSMATCH)

## 2021-05-10 SURGERY — TOTAL HIP REVISION
Anesthesia: Spinal | Site: Hip | Laterality: Left

## 2021-05-10 MED ORDER — METOCLOPRAMIDE HCL 5 MG PO TABS: 5.0000 mg | ORAL_TABLET | Freq: Three times a day (TID) | ORAL | Status: DC | PRN

## 2021-05-10 MED ORDER — PHENOL 1.4 % MT LIQD: 1.0000 | OROMUCOSAL | Status: DC | PRN

## 2021-05-10 MED ORDER — CEFAZOLIN SODIUM-DEXTROSE 2-4 GM/100ML-% IV SOLN
2.0000 g | Freq: Four times a day (QID) | INTRAVENOUS | Status: AC
  Administered 2021-05-10 – 2021-05-11 (×2): 2 g via INTRAVENOUS
  Filled 2021-05-10 (×2): qty 100

## 2021-05-10 MED ORDER — METHOCARBAMOL 500 MG IVPB - SIMPLE MED
500.0000 mg | Freq: Four times a day (QID) | INTRAVENOUS | Status: DC | PRN
  Administered 2021-05-10: 500 mg via INTRAVENOUS
  Filled 2021-05-10: qty 50

## 2021-05-10 MED ORDER — FENTANYL CITRATE PF 50 MCG/ML IJ SOSY
PREFILLED_SYRINGE | INTRAMUSCULAR | Status: AC
  Filled 2021-05-10: qty 1

## 2021-05-10 MED ORDER — HYDROMORPHONE HCL 1 MG/ML IJ SOLN
INTRAMUSCULAR | Status: AC
  Administered 2021-05-10: 0.5 mg via INTRAVENOUS
  Filled 2021-05-10: qty 1

## 2021-05-10 MED ORDER — METHOCARBAMOL 500 MG IVPB - SIMPLE MED
INTRAVENOUS | Status: AC
  Filled 2021-05-10: qty 50

## 2021-05-10 MED ORDER — PHENYLEPHRINE HCL-NACL 20-0.9 MG/250ML-% IV SOLN
INTRAVENOUS | Status: DC | PRN
  Administered 2021-05-10: 50 ug/min via INTRAVENOUS

## 2021-05-10 MED ORDER — ACETAMINOPHEN 10 MG/ML IV SOLN
1000.0000 mg | Freq: Once | INTRAVENOUS | Status: DC | PRN
  Administered 2021-05-10: 1000 mg via INTRAVENOUS

## 2021-05-10 MED ORDER — CEFAZOLIN SODIUM-DEXTROSE 2-4 GM/100ML-% IV SOLN
2.0000 g | INTRAVENOUS | Status: AC
  Administered 2021-05-10: 2 g via INTRAVENOUS
  Filled 2021-05-10: qty 100

## 2021-05-10 MED ORDER — HYDROCODONE-ACETAMINOPHEN 5-325 MG PO TABS
1.0000 | ORAL_TABLET | ORAL | Status: DC | PRN
  Administered 2021-05-10 – 2021-05-15 (×15): 2 via ORAL
  Filled 2021-05-10 (×15): qty 2

## 2021-05-10 MED ORDER — MIDAZOLAM HCL 5 MG/5ML IJ SOLN
INTRAMUSCULAR | Status: DC | PRN
Start: 2021-05-10 — End: 2021-05-10
  Administered 2021-05-10: 2 mg via INTRAVENOUS

## 2021-05-10 MED ORDER — LACTATED RINGERS IV SOLN: INTRAVENOUS | Status: DC

## 2021-05-10 MED ORDER — 0.9 % SODIUM CHLORIDE (POUR BTL) OPTIME
TOPICAL | Status: DC | PRN
  Administered 2021-05-10: 1000 mL

## 2021-05-10 MED ORDER — MIDAZOLAM HCL 2 MG/2ML IJ SOLN
INTRAMUSCULAR | Status: AC
  Filled 2021-05-10: qty 2

## 2021-05-10 MED ORDER — MENTHOL 3 MG MT LOZG: 1.0000 | LOZENGE | OROMUCOSAL | Status: DC | PRN

## 2021-05-10 MED ORDER — POLYETHYLENE GLYCOL 3350 17 G PO PACK: 17.0000 g | PACK | Freq: Every day | ORAL | Status: DC | PRN

## 2021-05-10 MED ORDER — METOCLOPRAMIDE HCL 5 MG/ML IJ SOLN: 5.0000 mg | Freq: Three times a day (TID) | INTRAMUSCULAR | Status: DC | PRN

## 2021-05-10 MED ORDER — TRANEXAMIC ACID-NACL 1000-0.7 MG/100ML-% IV SOLN
1000.0000 mg | Freq: Once | INTRAVENOUS | Status: AC
  Administered 2021-05-10: 1000 mg via INTRAVENOUS
  Filled 2021-05-10: qty 100

## 2021-05-10 MED ORDER — MORPHINE SULFATE (PF) 2 MG/ML IV SOLN
0.5000 mg | INTRAVENOUS | Status: DC | PRN
  Administered 2021-05-10: 1 mg via INTRAVENOUS
  Filled 2021-05-10: qty 1

## 2021-05-10 MED ORDER — BISACODYL 10 MG RE SUPP: 10.0000 mg | Freq: Every day | RECTAL | Status: DC | PRN

## 2021-05-10 MED ORDER — STERILE WATER FOR IRRIGATION IR SOLN
Status: DC | PRN
  Administered 2021-05-10: 2000 mL

## 2021-05-10 MED ORDER — SODIUM CHLORIDE 0.9 % IV SOLN: 10.0000 mL/h | Freq: Once | INTRAVENOUS | Status: DC

## 2021-05-10 MED ORDER — FERROUS SULFATE 325 (65 FE) MG PO TABS
325.0000 mg | ORAL_TABLET | Freq: Three times a day (TID) | ORAL | Status: DC
  Administered 2021-05-11 – 2021-05-15 (×14): 325 mg via ORAL
  Filled 2021-05-10 (×14): qty 1

## 2021-05-10 MED ORDER — DOCUSATE SODIUM 100 MG PO CAPS
100.0000 mg | ORAL_CAPSULE | Freq: Two times a day (BID) | ORAL | Status: DC
  Administered 2021-05-10 – 2021-05-13 (×7): 100 mg via ORAL
  Filled 2021-05-10 (×8): qty 1

## 2021-05-10 MED ORDER — TRANEXAMIC ACID 650 MG PO TABS (ORTHO)
1950.0000 mg | ORAL_TABLET | Freq: Every day | ORAL | Status: AC
  Administered 2021-05-11 – 2021-05-14 (×4): 1950 mg via ORAL
  Filled 2021-05-10 (×4): qty 3

## 2021-05-10 MED ORDER — ONDANSETRON HCL 4 MG/2ML IJ SOLN
INTRAMUSCULAR | Status: DC | PRN
  Administered 2021-05-10: 4 mg via INTRAVENOUS

## 2021-05-10 MED ORDER — DEXAMETHASONE SODIUM PHOSPHATE 10 MG/ML IJ SOLN
10.0000 mg | Freq: Once | INTRAMUSCULAR | Status: AC
  Administered 2021-05-11: 10 mg via INTRAVENOUS
  Filled 2021-05-10: qty 1

## 2021-05-10 MED ORDER — ONDANSETRON HCL 4 MG/2ML IJ SOLN: 4.0000 mg | Freq: Once | INTRAMUSCULAR | Status: DC | PRN

## 2021-05-10 MED ORDER — METHOCARBAMOL 500 MG PO TABS
500.0000 mg | ORAL_TABLET | Freq: Four times a day (QID) | ORAL | Status: DC | PRN
  Administered 2021-05-10 – 2021-05-14 (×8): 500 mg via ORAL
  Filled 2021-05-10 (×8): qty 1

## 2021-05-10 MED ORDER — ASPIRIN 81 MG PO CHEW
81.0000 mg | CHEWABLE_TABLET | Freq: Two times a day (BID) | ORAL | Status: DC
  Administered 2021-05-10 – 2021-05-15 (×10): 81 mg via ORAL
  Filled 2021-05-10 (×10): qty 1

## 2021-05-10 MED ORDER — FUROSEMIDE 20 MG PO TABS
20.0000 mg | ORAL_TABLET | Freq: Every morning | ORAL | Status: DC
  Administered 2021-05-11 – 2021-05-13 (×3): 20 mg via ORAL
  Filled 2021-05-10 (×5): qty 1

## 2021-05-10 MED ORDER — FENTANYL CITRATE (PF) 100 MCG/2ML IJ SOLN
INTRAMUSCULAR | Status: AC
  Filled 2021-05-10: qty 2

## 2021-05-10 MED ORDER — ONDANSETRON HCL 4 MG/2ML IJ SOLN
INTRAMUSCULAR | Status: AC
  Filled 2021-05-10: qty 2

## 2021-05-10 MED ORDER — HYDROMORPHONE HCL 1 MG/ML IJ SOLN
0.2500 mg | INTRAMUSCULAR | Status: DC | PRN
  Administered 2021-05-10 (×2): 0.5 mg via INTRAVENOUS

## 2021-05-10 MED ORDER — FENTANYL CITRATE PF 50 MCG/ML IJ SOSY
25.0000 ug | PREFILLED_SYRINGE | INTRAMUSCULAR | Status: AC | PRN
  Administered 2021-05-10: 50 ug via INTRAVENOUS

## 2021-05-10 MED ORDER — ACETAMINOPHEN 10 MG/ML IV SOLN
INTRAVENOUS | Status: AC
  Filled 2021-05-10: qty 100

## 2021-05-10 MED ORDER — ONDANSETRON HCL 4 MG/2ML IJ SOLN: 4.0000 mg | Freq: Four times a day (QID) | INTRAMUSCULAR | Status: DC | PRN

## 2021-05-10 MED ORDER — FENTANYL CITRATE PF 50 MCG/ML IJ SOSY
PREFILLED_SYRINGE | INTRAMUSCULAR | Status: AC
  Administered 2021-05-10: 50 ug via INTRAVENOUS
  Filled 2021-05-10: qty 1

## 2021-05-10 MED ORDER — DEXAMETHASONE SODIUM PHOSPHATE 10 MG/ML IJ SOLN
8.0000 mg | Freq: Once | INTRAMUSCULAR | Status: AC
  Administered 2021-05-10: 8 mg via INTRAVENOUS

## 2021-05-10 MED ORDER — POVIDONE-IODINE 10 % EX SWAB
2.0000 "application " | Freq: Once | CUTANEOUS | Status: AC
  Administered 2021-05-10: 2 via TOPICAL

## 2021-05-10 MED ORDER — TRANEXAMIC ACID-NACL 1000-0.7 MG/100ML-% IV SOLN
1000.0000 mg | INTRAVENOUS | Status: AC
  Administered 2021-05-10: 1000 mg via INTRAVENOUS
  Filled 2021-05-10: qty 100

## 2021-05-10 MED ORDER — FENTANYL CITRATE (PF) 100 MCG/2ML IJ SOLN
INTRAMUSCULAR | Status: DC | PRN
  Administered 2021-05-10 (×2): 25 ug via INTRAVENOUS
  Administered 2021-05-10: 50 ug via INTRAVENOUS

## 2021-05-10 MED ORDER — PROPOFOL 10 MG/ML IV BOLUS
INTRAVENOUS | Status: DC | PRN
  Administered 2021-05-10: 20 mg via INTRAVENOUS

## 2021-05-10 MED ORDER — PROPOFOL 500 MG/50ML IV EMUL
INTRAVENOUS | Status: DC | PRN
  Administered 2021-05-10: 75 ug/kg/min via INTRAVENOUS

## 2021-05-10 MED ORDER — ORAL CARE MOUTH RINSE
15.0000 mL | Freq: Once | OROMUCOSAL | Status: AC
  Administered 2021-05-10: 15 mL via OROMUCOSAL

## 2021-05-10 MED ORDER — DEXAMETHASONE SODIUM PHOSPHATE 10 MG/ML IJ SOLN
INTRAMUSCULAR | Status: AC
  Filled 2021-05-10: qty 1

## 2021-05-10 MED ORDER — DIPHENHYDRAMINE HCL 12.5 MG/5ML PO ELIX: 12.5000 mg | ORAL_SOLUTION | ORAL | Status: DC | PRN

## 2021-05-10 MED ORDER — POTASSIUM CHLORIDE CRYS ER 10 MEQ PO TBCR
10.0000 meq | EXTENDED_RELEASE_TABLET | Freq: Every morning | ORAL | Status: DC
  Administered 2021-05-11: 10 meq via ORAL
  Filled 2021-05-10: qty 1

## 2021-05-10 MED ORDER — HYDROCODONE-ACETAMINOPHEN 7.5-325 MG PO TABS
1.0000 | ORAL_TABLET | ORAL | Status: DC | PRN
  Administered 2021-05-12 – 2021-05-15 (×10): 2 via ORAL
  Filled 2021-05-10 (×11): qty 2

## 2021-05-10 MED ORDER — SODIUM CHLORIDE 0.9 % IV SOLN: INTRAVENOUS | Status: DC

## 2021-05-10 MED ORDER — ONDANSETRON HCL 4 MG PO TABS: 4.0000 mg | ORAL_TABLET | Freq: Four times a day (QID) | ORAL | Status: DC | PRN

## 2021-05-10 MED ORDER — ACETAMINOPHEN 325 MG PO TABS: 325.0000 mg | ORAL_TABLET | Freq: Four times a day (QID) | ORAL | Status: DC | PRN

## 2021-05-10 MED ORDER — METHYLPHENIDATE HCL 10 MG PO TABS: 20.0000 mg | ORAL_TABLET | Freq: Every day | ORAL | Status: DC | PRN

## 2021-05-10 MED ORDER — CHLORHEXIDINE GLUCONATE 0.12 % MT SOLN: 15.0000 mL | Freq: Once | OROMUCOSAL | Status: AC

## 2021-05-10 SURGICAL SUPPLY — 44 items
ADH SKN CLS APL DERMABOND .7 (GAUZE/BANDAGES/DRESSINGS) ×1
BAG COUNTER SPONGE SURGICOUNT (BAG) IMPLANT
BAG SPEC THK2 15X12 ZIP CLS (MISCELLANEOUS)
BAG SPNG CNTER NS LX DISP (BAG)
BAG ZIPLOCK 12X15 (MISCELLANEOUS) IMPLANT
BLADE SAW SGTL 11.0X1.19X90.0M (BLADE) ×2 IMPLANT
CLOTH BEACON ORANGE TIMEOUT ST (SAFETY) ×2 IMPLANT
COVER PERINEAL POST (MISCELLANEOUS) ×2 IMPLANT
COVER SURGICAL LIGHT HANDLE (MISCELLANEOUS) ×2 IMPLANT
CUP ACET PINNACLE SECTR 56MM (Hips) ×1 IMPLANT
DERMABOND ADVANCED (GAUZE/BANDAGES/DRESSINGS) ×1
DERMABOND ADVANCED .7 DNX12 (GAUZE/BANDAGES/DRESSINGS) ×1 IMPLANT
DRAPE STERI IOBAN 125X83 (DRAPES) ×2 IMPLANT
DRAPE U-SHAPE 47X51 STRL (DRAPES) ×4 IMPLANT
DRESSING AQUACEL AG SP 3.5X10 (GAUZE/BANDAGES/DRESSINGS) ×1 IMPLANT
DRSG AQUACEL AG ADV 3.5X10 (GAUZE/BANDAGES/DRESSINGS) ×2 IMPLANT
DRSG AQUACEL AG SP 3.5X10 (GAUZE/BANDAGES/DRESSINGS) ×2
DURAPREP 26ML APPLICATOR (WOUND CARE) ×2 IMPLANT
ELECT REM PT RETURN 15FT ADLT (MISCELLANEOUS) ×2 IMPLANT
ELIMINATOR HOLE APEX DEPUY (Hips) ×2 IMPLANT
GLOVE SURG ENC MOIS LTX SZ6 (GLOVE) ×4 IMPLANT
GLOVE SURG UNDER POLY LF SZ6.5 (GLOVE) ×2 IMPLANT
GLOVE SURG UNDER POLY LF SZ7.5 (GLOVE) ×6 IMPLANT
GOWN STRL REUS W/TWL LRG LVL3 (GOWN DISPOSABLE) ×4 IMPLANT
HEAD FEM STD 28X+12 (Hips) ×2 IMPLANT
HOLDER FOLEY CATH W/STRAP (MISCELLANEOUS) ×2 IMPLANT
INSERT DM PINNACLE 56X49 (Insert) ×2 IMPLANT
KIT TURNOVER KIT A (KITS) ×2 IMPLANT
LINER BI-MENTUM ALTRX 49X28 (Liner) ×2 IMPLANT
PACK ANTERIOR HIP CUSTOM (KITS) ×2 IMPLANT
PENCIL SMOKE EVACUATOR (MISCELLANEOUS) ×2 IMPLANT
PINNACLE SECTOR CUP 56MM (Hips) ×2 IMPLANT
SAW OSC TIP CART 19.5X105X1.3 (SAW) ×2 IMPLANT
SCREW 6.5MMX40MM (Screw) ×2 IMPLANT
SCREW PINN CAN BONE 6.5MMX15MM (Screw) ×2 IMPLANT
SCREW PINN CAN BOWN 6.5X8 HIP (Screw) ×2 IMPLANT
SUT MNCRL AB 4-0 PS2 18 (SUTURE) ×2 IMPLANT
SUT VIC AB 1 CT1 36 (SUTURE) ×6 IMPLANT
SUT VIC AB 2-0 CT1 27 (SUTURE) ×4
SUT VIC AB 2-0 CT1 TAPERPNT 27 (SUTURE) ×2 IMPLANT
SUT VLOC 180 0 24IN GS25 (SUTURE) ×2 IMPLANT
TRAY FOLEY MTR SLVR 16FR STAT (SET/KITS/TRAYS/PACK) ×2 IMPLANT
TUBE SUCTION HIGH CAP CLEAR NV (SUCTIONS) ×2 IMPLANT
WATER STERILE IRR 1000ML POUR (IV SOLUTION) ×2 IMPLANT

## 2021-05-10 NOTE — Interval H&P Note (Signed)
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  The various methods of treatment have been discussed with the patient and family. After consideration of risks, benefits and other options for treatment, the patient has consented to  Procedure(s) with comments: [place] (Left) - 2 HRS as a surgical intervention.  The patient's history has been reviewed, patient examined, no change in status, stable for surgery.  I have reviewed the patient's chart and labs.  Questions were answered to the patient's satisfaction.     Mauri Pole

## 2021-05-10 NOTE — Discharge Instructions (Signed)

## 2021-05-10 NOTE — Anesthesia Procedure Notes (Signed)
Spinal  Patient location during procedure: OR End time: 05/10/2021 1:31 PM Reason for block: surgical anesthesia Staffing Performed: resident/CRNA  Resident/CRNA: Garret Teale D, CRNA Preanesthetic Checklist Completed: patient identified, IV checked, site marked, risks and benefits discussed, surgical consent, monitors and equipment checked, pre-op evaluation and timeout performed Spinal Block Patient position: sitting Prep: ChloraPrep Patient monitoring: heart rate, continuous pulse ox and blood pressure Approach: right paramedian Location: L4-5 Injection technique: single-shot Needle Needle type: Spinocan  Needle gauge: 24 G Needle length: 9 cm Assessment Sensory level: T6 Events: CSF return

## 2021-05-10 NOTE — H&P (Signed)
TOTAL HIP REVISION ADMISSION H&P  Patient is admitted for left revision total hip arthroplasty.  Subjective:  Chief Complaint: Recurrent instability, left total hip   HPI: Miranda Graham, 65 y.o. female, has a history of pain and functional disability in the left hip due to  recurrent dislocation  and patient has failed non-surgical conservative treatments for greater than 12 weeks to include activity modification. The indications for the revision total hip arthroplasty are  recurrent instability .  She had left anterior hip replacement in August 2021 by Dr. Nilsa Nutting. She subsequently has had 5 hip dislocations. Most recently, she had right total hip arthroplasty by Dr. Alvan Dame on 04/02/21. It was originally planned to stage the left hip revision after the right THA, but she unfortunately had another dislocation in the interim. Dr. Alvan Dame discussed with her plans for surgical revision, including risks, benefits, and expectations and she does wish to proceed. There is no current active infection.  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   Current Outpatient Medications  Medication Sig Dispense Refill Last Dose   ferrous sulfate 325 (65 FE) MG tablet Take 1 tablet (325 mg total) by mouth 3 (three) times daily after meals for 14 days. 42 tablet 0    furosemide (LASIX) 20 MG tablet Take 20 mg by  mouth in the morning.      methocarbamol (ROBAXIN) 500 MG tablet Take 1 tablet (500 mg total) by mouth every 6 (six) hours as needed for muscle spasms. 40 tablet 0    methylphenidate (RITALIN) 20 MG tablet Take 20 mg by mouth 5 (five) times daily as needed (attention/focus).      Multiple Vitamin (MULTIVITAMIN WITH MINERALS) TABS tablet Take 1 tablet by mouth in the morning.      potassium chloride (KLOR-CON) 10 MEQ tablet Take 10 mEq by mouth in the morning.      docusate sodium (COLACE) 100 MG capsule Take 1 capsule (100 mg total) by mouth 2 (two) times daily. (Patient not taking: No sig reported) 10 capsule 0 Not Taking   HYDROcodone-acetaminophen (NORCO) 7.5-325 MG tablet Take 1-2 tablets by mouth every 6 (six) hours as needed for severe pain (pain score 7-10). (Patient not taking: No sig reported) 42 tablet 0 Not Taking   polyethylene glycol (MIRALAX / GLYCOLAX) 17 g packet Take 17 g by mouth daily as needed for mild constipation. (Patient not taking: No sig reported) 14 each 0 Not Taking   Allergies  Allergen Reactions   Shellfish Allergy Itching, Swelling and Other (See Comments)    Facial swelling   Levofloxacin Other (See Comments)    Muscle Pain Pain in calves, muscle    Social History   Tobacco Use   Smoking status: Former    Packs/day: 1.00    Years: 15.00    Pack years: 15.00  Types: Cigarettes   Smokeless tobacco: Never  Substance Use Topics   Alcohol use: Never    No family history on file.    Review of Systems  Constitutional:  Negative for chills and fever.  Respiratory:  Negative for cough and shortness of breath.   Cardiovascular:  Negative for chest pain.  Gastrointestinal:  Negative for nausea and vomiting.  Musculoskeletal:  Positive for arthralgias.    Objective:  Physical Exam Well nourished and well developed. General: Alert and oriented x3, cooperative and pleasant, no acute distress. Head: normocephalic, atraumatic, neck supple. Eyes:  EOMI.  Musculoskeletal: Left Hip Exam: Well healed arthroplasty scar. ROM deferred today due to recent dislocation.  Calves soft and nontender. Motor function intact in LE. Strength 5/5 LE bilaterally. Neuro: Distal pulses 2+. Sensation to light touch intact in LE.  Radiographs: X-rays were personally ordered and reviewed by me and my interpretation was discussed in detail with the patient.  AP pelvis dated 05/03/21 shows bilateral total hip arthroplasties in stable positioning. The right hip is without periprosthetic abnormalities.  Vital signs in last 24 hours:     Labs:   Estimated body mass index is 35.55 kg/m as calculated from the following:   Height as of 05/02/21: 5\' 2"  (1.575 m).   Weight as of 05/02/21: 88.2 kg.  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  The patient history, physical examination, clinical judgement of the provider and imaging studies are consistent with recurrent instability of the left hip(s), previous total hip arthroplasty. Revision total hip arthroplasty is deemed medically necessary. The treatment options including medical management and arthroplasty were discussed at length. The risks and benefits of total hip arthroplasty were presented and reviewed. The risks due to aseptic loosening, infection, stiffness, dislocation/subluxation,  thromboembolic complications and other imponderables were discussed.  The patient acknowledged the explanation, agreed to proceed with the plan and consent was signed. Patient is being admitted for inpatient treatment for surgery, pain control, PT, OT, prophylactic antibiotics, VTE prophylaxis, progressive ambulation and ADL's and discharge planning. The patient is planning to be discharged  home.  Therapy Plans: HEP Disposition: Home with sister and family Planned DVT Prophylaxis: aspirin 81mg  BID DME needed: none PCP:  Dr. Fransisco Beau Cardiologist: Dr. Clayborn Bigness, clearance received ( saw 1 time in January for LE edema - no hx of MI, arrhythmias, stroke, stents) TXA: IV Allergies: levaquin - calf pain, shellfish - angioedema Anesthesia Concerns: none BMI: 34.4 Last HgbA1c: Not diabetic  Other: - Hx of ovarian CA 30 years ago - Hydrocodone 7.5, robaxin - Hemoglobin of 8.2, chronic anemia   Griffith Citron, PA-C Orthopedic Surgery [facility] [place] [phone]

## 2021-05-10 NOTE — Brief Op Note (Signed)
05/10/2021  3:21 PM  PATIENT:  Miranda Graham  65 y.o. 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   Delay start of Pharmacological VTE agent (>24hrs) due to surgical blood loss or risk of bleeding: no

## 2021-05-10 NOTE — Transfer of Care (Addendum)
Immediate Anesthesia Transfer of Care Note  Patient: Miranda Graham  Procedure(s) Performed: TOTAL HIP REVISION-ANTERIOR APPROACH (Left: Hip)  Patient Location: PACU  Anesthesia Type:Spinal  Level of Consciousness: awake, alert  and oriented  Airway & Oxygen Therapy: Patient Spontanous Breathing and Patient connected to face mask oxygen  Post-op Assessment: Report given to RN and Post -op Vital signs reviewed and stable  Post vital signs: Reviewed and stable  Last Vitals:  Vitals Value Taken Time  BP 120/66 05/10/21 1536  Temp    Pulse 76 05/10/21 1544  Resp 20 05/10/21 1544  SpO2 95 % 05/10/21 1544  Vitals shown include unvalidated device data.  Last Pain:  Vitals:   05/10/21 1119  TempSrc: Oral         Complications: No notable events documented.

## 2021-05-11 ENCOUNTER — Other Ambulatory Visit: Payer: Self-pay

## 2021-05-11 ENCOUNTER — Encounter (HOSPITAL_COMMUNITY): Payer: Self-pay | Admitting: Orthopedic Surgery

## 2021-05-11 LAB — BASIC METABOLIC PANEL
Anion gap: 11 (ref 5–15)
BUN: 18 mg/dL (ref 8–23)
CO2: 28 mmol/L (ref 22–32)
Calcium: 9.2 mg/dL (ref 8.9–10.3)
Chloride: 106 mmol/L (ref 98–111)
Creatinine, Ser: 0.78 mg/dL (ref 0.44–1.00)
GFR, Estimated: >60 mL/min (ref >60)
Glucose, Bld: 121 mg/dL — ABNORMAL HIGH (ref 70–99)
Potassium: 3.1 mmol/L — ABNORMAL LOW (ref 3.5–5.1)
Sodium: 145 mmol/L (ref 135–145)

## 2021-05-11 LAB — CBC
HCT: 25.1 % — ABNORMAL LOW (ref 36.0–46.0)
Hemoglobin: 7.9 g/dL — ABNORMAL LOW (ref 12.0–15.0)
MCH: 28.2 pg (ref 26.0–34.0)
MCHC: 31.5 g/dL (ref 30.0–36.0)
MCV: 89.6 fL (ref 80.0–100.0)
Platelets: 387 10*3/uL (ref 150–400)
RBC: 2.8 MIL/uL — ABNORMAL LOW (ref 3.87–5.11)
RDW: 16.6 % — ABNORMAL HIGH (ref 11.5–15.5)
WBC: 16.4 10*3/uL — ABNORMAL HIGH (ref 4.0–10.5)
nRBC: 0 % (ref 0.0–0.2)

## 2021-05-11 MED ORDER — CHLORHEXIDINE GLUCONATE CLOTH 2 % EX PADS
6.0000 | MEDICATED_PAD | Freq: Every day | CUTANEOUS | Status: DC
  Administered 2021-05-12 – 2021-05-14 (×3): 6 via TOPICAL

## 2021-05-11 NOTE — Op Note (Signed)
NAME: Miranda Graham, Miranda Graham MEDICAL RECORD NO: [id number] ACCOUNT NO: [id number] DATE OF BIRTH: 1956-02-15 FACILITY: [facility] PHYSICIAN: Pietro Cassis. Alvan Dame, MD  Operative Report   DATE OF PROCEDURE: 05/10/2021  PREOPERATIVE DIAGNOSIS:  Failed left total hip replacement due to recurrent episodes of instability associated with fracture of the greater trochanter.  POSTOPERATIVE DIAGNOSIS:  Failed left total hip replacement due to recurrent episodes of instability associated with fracture of the greater trochanter.  PROCEDURE:  Revision left total hip arthroplasty.  COMPONENTS USED IMPLANTS:  DePuy revision hip system with a size 56 Pinnacle shell and a size 56 x 49 dual mobility metal liner, a size 49 x 28 AltrX bi-mentum liner with an associated 28 +12 Articul/eze metal ball.  SURGEON:  Dr. Alvan Dame.  ASSISTANT:  Costella Hatcher, PA-C.  Note, [name] was present for the entirety of the case from preoperative positioning, perioperative management of the operative extremity, general facilitation of the case and primary wound closure.  ANESTHESIA:  Spinal.  SPECIMENS:  None.  COMPLICATIONS:  None.  DRAINS:  None.   BLOOD LOSS: About 500 mL.  INDICATIONS OF THE PROCEDURE:  The patient is a very pleasant 65 year old female with history of left total hip arthroplasty that was complicated by recurrent episodes of instability.  She had been seen and evaluated for revision of this.  We had  previously addressed a significantly arthritic right hip with a total hip arthroplasty.  We are hoping to get her some period of time of recovery prior to proceeding.  However, she had a recurrent episode of dislocation of her left hip during the  postoperative period from her right hip from September.  Based on this, I tried to get her on to the operating room schedule sooner than later to try to help prevent this from recurring due to the pain and inconvenience associated with it.  We had a lengthy  discussion about the condition of her left hip and the fracture of her greater trochanter and the medialization of the trochanter, resulting in an inherent abductor weakness.  I felt that we had a chance with newer technology of the dual  mobility cup and liner systems to enhance support to the hip also by adding length.  This would allow me also to revise her acetabular shell and take some of the abduction out of it and then hopefully allow for bony ingrowth in case there was recurrent  instability, where a constrained liner would be of value.  I did not feel that converting her straight to a constrained liner would be in her best interest given the position of the cup and the potential for recurrent levels of stress resulting in  polyethylene dislocation.  Consent was obtained after reviewing the risk of infection, DVT, recurrent dislocation, neurovascular injury and the need for future surgeries.  She was amenable to the plan and consent was obtained.  DESCRIPTION OF PROCEDURE:  The patient was brought to the operative theater.  Once adequate anesthesia, preoperative antibiotics, Ancef was administered as well as tranexamic acid and Decadron.  She was positioned supine on the Hana table.  Once adequate  padding and protection of all bony prominences was carried out, we did apply tape over her abdominal pannus to pull it over.  Her landmarks were identified and her old incision was evaluated and felt to be in a close enough proximity to utilize for an  anterior approach to the hip.  Her previous approach was not so modified anterior approach.  At this point, the left hip was prepped and draped in a sterile fashion.  A time-out was performed identifying the patient, planned procedure and extremity.  Her  old incision was utilized and excised a portion of her scar.  Soft tissue planes were created down to the tensor fascia lata, which was identified based on the orientation of the anterior superior iliac  spine.  The fascia was then elevated off the  muscle and the muscle swept laterally.  I was able to expose the anterior aspect of the hip and performed a capsulectomy circumferentially identifying the hip and the anterior two-thirds.  With a hip exposed, I then applied traction and locked this down.   I then used a bone tamp and dislodged the femoral head from the trunnion.  We then dislocated the head and the femoral head was removed.  I kept the hip at 100 degrees.  Rotation was then applied.  Retractors inferiorly, anterior and posterior.  I then  was able to further debride scar tissue from around the acetabular shell.  Once I had the rim of the acetabulum exposed, we used the Innomed explant system and started with a size 50 to match her shell starter blade.  I worked this around it  circumferentially and then changed to the F blade and removed the cup with some bone loss along the superior ilium, but this was not excessive and there was no disruption of her acetabular ring.  Given this, I began reaming.  I reamed up the 55 mm where  I felt it had good purchase.  I did not want to ream further.  I then selected a 56 Pinnacle shell and impacted it under fluoroscopic imaging.  Under direct visualization and palpation, I was able to fill the anterior rim of the acetabulum and felt that  her abduction and forward flexion were improved as compared to her preoperative films.  I then placed 2 cancellous screws into the ilium and then placed a trial liner for a dual mobility system.  We did a trial reduction with a 28+5 inner ball on top of  the 49 x 28 trial dual mobility ball.  The hip was reduced.  There was no evidence of impingement with extension and external rotation, nor with internal rotation.  I did feel there was a little bit of shuck in neutral and at 40 degrees of external  rotation and thus made a decision to go up to the +12 ball to try to make sure her hip was as was snug as possible.   Radiographically, there was some evidence of lengthening by anywhere from prior closed, no more than 5 mm.  I felt that the offset was  improved with this construct as well.  Given these findings, all the trial components were removed.  The trial liners and component was removed.  I placed a hole eliminator.  We then placed the 56 x 49 dual mobility metal liner with good circumferential  rim fit.  We then on the back table constructed the 28, +12 Articul/eze metal ball inside of the 49 x 28 bi-mentum AltrX liner.  This construct was then impacted onto a clean and dried trunnion and the hip was reduced.  We irrigated the hip with about a  liter of normal saline solution.  We then reapproximated the fascia of the tensor fascia lata muscle over top of the muscle and the wound with a #1 Vicryl and #1 Stratafix suture.  The remainder of the  wound was closed with 2-0 Vicryl and a running  Monocryl stitch.  The hip was then cleaned, dried and dressed sterilely using surgical glue and Aquacel dressing.  The patient was then brought to the recovery room in stable condition, tolerating the procedure well.   MUK D: 05/10/2021 3:31:56 pm T: 05/11/2021 12:17:00 am  JOB: [id number]/ [id number]

## 2021-05-11 NOTE — Progress Notes (Signed)
   Subjective:  Miranda Graham is a 65 y.o. female, 1 Day Post-Op   Is/p Procedure(s): [name]   Patient reports pain as mild to moderate.  Patient reports she is actually doing pretty well with mild pain only just with some this motion that she did to get to the side of the bed.  She denies numbness or tingling. denies fever or chills.  Denies calf pain.  Objective:   VITALS:   Vitals:   05/11/21 0030 05/11/21 0030 05/11/21 0248 05/11/21 0546  BP: 110/69 110/69 113/61 (!) 112/55  Pulse: 87 87 78 82  Resp: 18 18 18 17   Temp: 98.6 F (37 C) 98.6 F (37 C) 98 F (36.7 C) 99.5 F (37.5 C)  TempSrc:  Oral Oral   SpO2:  100% 100% 97%  Weight:      Height:       Constitutional: General Appearance: healthy-appearing, well-nourished, and well-developed.  In hospital bed Level of Distress: no acute distress. Eyes: Lens (normal) clear: both eyes. Head: Head: normocephalic and atraumatic. Lungs: Respiratory effort: no dyspnea. Skin: Inspection and palpation: no rash, lesions Neurologic: Cranial Nerves: grossly intact. Sensation: grossly intact. Psychiatric: Insight: good judgement and insight. Mental Status: normal mood and affect and active and alert. Orientation: to time, place, and person.   Left lower extremity:  Neurovascular intact Sensation intact distally Intact pulses distally Dorsiflexion/Plantar flexion intact Incision: dressing C/D/I   Lab Results  Component Value Date   WBC 16.4 (H) 05/11/2021   HGB 7.9 (L) 05/11/2021   HCT 25.1 (L) 05/11/2021   MCV 89.6 05/11/2021   PLT 387 05/11/2021   BMET    Component Value Date/Time   NA 145 05/11/2021 0500   K 3.1 (L) 05/11/2021 0500   CL 106 05/11/2021 0500   CO2 28 05/11/2021 0500   GLUCOSE 121 (H) 05/11/2021 0500   BUN 18 05/11/2021 0500   CREATININE 0.78 05/11/2021 0500   CALCIUM 9.2 05/11/2021 0500   GFRNONAA >60 05/11/2021 0500     Assessment/Plan: 1 Day Post-Op    Active Problems:   s/p  revision of total replacement of left hip joint   S/P revision of total hip   Advance diet Up with therapy Will be rounded on tomorrow, possible discharge on Sunday.  Hemoglobin stable, transfuse if below 7 -history of chronic anemia.   Weightbearing Status: 50% partial weightbearing, Anterior hip precautions DVT Prophylaxis: Aspirin    Miranda Graham Miranda Graham 05/11/2021, 9:15 AM  Jonelle Sidle PA-C  Physician Assistant with Dr. Rogers, [place]

## 2021-05-11 NOTE — Evaluation (Signed)
Physical Therapy Evaluation Patient Details Name: Miranda Graham MRN: [id number] DOB: 1956-05-12 Today's Date: 05/11/2021  History of Present Illness  Pt s/p L THR revision and with hx of L THR; R THR by anterior direct approach (04/02/21), and ovarian CA  Clinical Impression  Pt s/p L THR revision and presents with decreased L LE strength/ROM, post op pain and anterior THP limiting functional mobility.  Pt should progress to dc home with family assist.     Recommendations for follow up therapy are one component of a multi-disciplinary discharge planning process, led by the attending physician.  Recommendations may be updated based on patient status, additional functional criteria and insurance authorization.  Follow Up Recommendations Follow surgeon's recommendation for DC plan and follow-up therapies    Equipment Recommendations  None recommended by PT    Recommendations for Other Services       Precautions / Restrictions Precautions Precautions: Fall;Other (comment);Anterior Hip Precaution Comments: L hip has had 4 dislocations in past year, had L THA August 2021. Restrictions Weight Bearing Restrictions: Yes LLE Weight Bearing: Partial weight bearing      Mobility  Bed Mobility Overal bed mobility: Needs Assistance Bed Mobility: Supine to Sit     Supine to sit: Min assist     General bed mobility comments: cues for sequence, use of R LE to self assist and adherence to anterior THP    Transfers Overall transfer level: Needs assistance Equipment used: Rolling walker (2 wheeled) Transfers: Sit to/from Stand Sit to Stand: Min assist         General transfer comment: VCs hand placement  Ambulation/Gait Ambulation/Gait assistance: Min assist Gait Distance (Feet): 34 Feet Assistive device: Rolling walker (2 wheeled) Gait Pattern/deviations: Step-to pattern Gait velocity: decr   General Gait Details: cues for sequence, posture, position from RW and  PWB  Stairs            Wheelchair Mobility    Modified Rankin (Stroke Patients Only)       Balance Overall balance assessment: Needs assistance Sitting-balance support: No upper extremity supported;Feet supported Sitting balance-Leahy Scale: Good     Standing balance support: Bilateral upper extremity supported Standing balance-Leahy Scale: Poor                               Pertinent Vitals/Pain Pain Assessment: 0-10 Pain Score: 5  Pain Location: R hip Pain Descriptors / Indicators: Aching Pain Intervention(s): Limited activity within patient's tolerance    Home Living Family/patient expects to be discharged to:: Private residence Living Arrangements: Other relatives Available Help at Discharge: Family;Available 24 hours/day Type of Home: House Home Access: Stairs to enter   [employer] of Steps: 1 Home Layout: One level Home Equipment: Shower seat;Hand held Tourist information centre manager - 2 wheels Additional Comments: will stay with sister for a month as pt lives alone in [place]. Info above is for sister's home.    Prior Function Level of Independence: Independent with assistive device(s)         Comments: used RW     Hand Dominance        Extremity/Trunk Assessment   Upper Extremity Assessment Upper Extremity Assessment: Overall WFL for tasks assessed    Lower Extremity Assessment Lower Extremity Assessment: LLE deficits/detail LLE Deficits / Details: 2+/5 hip with flex to ~80 AAROM    Cervical / Trunk Assessment Cervical / Trunk Assessment: Normal  Communication   Communication: No difficulties  Cognition Arousal/Alertness: Awake/alert Behavior During Therapy: WFL for tasks assessed/performed Overall Cognitive Status: Within Functional Limits for tasks assessed                                        General Comments      Exercises Total Joint Exercises Ankle Circles/Pumps: AROM;Both;20  reps;Supine Quad Sets: AROM;Right;Supine;10 reps Heel Slides: AAROM;Right;Supine;20 reps   Assessment/Plan    PT Assessment Patent does not need any further PT services  PT Problem List Pain;Decreased activity tolerance;Decreased strength;Decreased mobility;Decreased balance;Decreased range of motion;Decreased knowledge of precautions       PT Treatment Interventions DME instruction;Gait training;Stair training;Functional mobility training;Therapeutic activities;Therapeutic exercise;Balance training;Patient/family education    PT Goals (Current goals can be found in the Care Plan section)  Acute Rehab PT Goals Patient Stated Goal: Regain IND PT Goal Formulation: With patient Time For Goal Achievement: 05/18/21 Potential to Achieve Goals: Good    Frequency 7X/week   Barriers to discharge        Co-evaluation               AM-PAC PT "6 Clicks" Mobility  Outcome Measure Help needed turning from your back to your side while in a flat bed without using bedrails?: A Little Help needed moving from lying on your back to sitting on the side of a flat bed without using bedrails?: A Little Help needed moving to and from a bed to a chair (including a wheelchair)?: A Little Help needed standing up from a chair using your arms (e.g., wheelchair or bedside chair)?: A Little Help needed to walk in hospital room?: A Little Help needed climbing 3-5 steps with a railing? : A Lot 6 Click Score: 17    End of Session Equipment Utilized During Treatment: Gait belt Activity Tolerance: Patient tolerated treatment well Patient left: in chair;with call bell/phone within reach;with chair alarm set Nurse Communication: Mobility status PT Visit Diagnosis: Difficulty in walking, not elsewhere classified (R26.2);Pain Pain - Right/Left: Left Pain - part of body: Hip    Time: 0802-0855 PT Time Calculation (min) (ACUTE ONLY): 53 min   Charges:   PT Evaluation $PT Eval Low Complexity: 1  Low PT Treatments $Gait Training: 8-22 mins $Therapeutic Exercise: 8-22 mins        Debe Coder PT Acute Rehabilitation Services Pager [phone] Office [phone]   Niguel Moure 05/11/2021, 12:44 PM

## 2021-05-12 LAB — CBC
HCT: 24.3 % — ABNORMAL LOW (ref 36.0–46.0)
Hemoglobin: 7.4 g/dL — ABNORMAL LOW (ref 12.0–15.0)
MCH: 27.5 pg (ref 26.0–34.0)
MCHC: 30.5 g/dL (ref 30.0–36.0)
MCV: 90.3 fL (ref 80.0–100.0)
Platelets: 370 10*3/uL (ref 150–400)
RBC: 2.69 MIL/uL — ABNORMAL LOW (ref 3.87–5.11)
RDW: 17.3 % — ABNORMAL HIGH (ref 11.5–15.5)
WBC: 11.4 10*3/uL — ABNORMAL HIGH (ref 4.0–10.5)
nRBC: 0 % (ref 0.0–0.2)

## 2021-05-12 MED ORDER — CALCIUM CARBONATE ANTACID 500 MG PO CHEW
2.0000 | CHEWABLE_TABLET | Freq: Two times a day (BID) | ORAL | Status: DC | PRN
  Administered 2021-05-12 – 2021-05-13 (×2): 400 mg via ORAL
  Filled 2021-05-12 (×2): qty 2

## 2021-05-12 MED ORDER — POTASSIUM CHLORIDE CRYS ER 20 MEQ PO TBCR
40.0000 meq | EXTENDED_RELEASE_TABLET | Freq: Once | ORAL | Status: AC
  Administered 2021-05-12: 40 meq via ORAL
  Filled 2021-05-12: qty 2

## 2021-05-12 MED ORDER — POTASSIUM CHLORIDE CRYS ER 10 MEQ PO TBCR
10.0000 meq | EXTENDED_RELEASE_TABLET | Freq: Every morning | ORAL | Status: DC
  Administered 2021-05-13 – 2021-05-15 (×3): 10 meq via ORAL
  Filled 2021-05-12 (×3): qty 1

## 2021-05-12 NOTE — Progress Notes (Signed)
Physical Therapy Treatment Patient Details Name: Miranda Graham MRN: [id number] DOB: 02-24-1956 Today's Date: 05/12/2021   History of Present Illness Pt s/p L THR revision and with hx of L THR; R THR by anterior direct approach (04/02/21), and ovarian CA    PT Comments    Pt in good spirits and progressing with mobility but requiring increased time and cues and continues to fatigue easily. Hgb 7.4 but pt denies dizziness with activity.   Recommendations for follow up therapy are one component of a multi-disciplinary discharge planning process, led by the attending physician.  Recommendations may be updated based on patient status, additional functional criteria and insurance authorization.  Follow Up Recommendations  Follow surgeon's recommendation for DC plan and follow-up therapies     Equipment Recommendations  None recommended by PT    Recommendations for Other Services       Precautions / Restrictions Precautions Precautions: Fall;Other (comment);Anterior Hip Precaution Comments: L hip has had 4 dislocations in past year, had L THA August 2021. Restrictions Weight Bearing Restrictions: Yes LLE Weight Bearing: Partial weight bearing LLE Partial Weight Bearing Percentage or Pounds: 50%     Mobility  Bed Mobility               General bed mobility comments: Pt up on EOB on arrival and in chair at session end    Transfers Overall transfer level: Needs assistance Equipment used: Rolling walker (2 wheeled) Transfers: Sit to/from Stand Sit to Stand: Min assist         General transfer comment: VCs hand placement and LE management  Ambulation/Gait Ambulation/Gait assistance: Min assist Gait Distance (Feet): 36 Feet Assistive device: Rolling walker (2 wheeled) Gait Pattern/deviations: Step-to pattern Gait velocity: decr   General Gait Details: cues for sequence, posture, position from RW and PWB   Stairs             Wheelchair Mobility     Modified Rankin (Stroke Patients Only)       Balance Overall balance assessment: Needs assistance Sitting-balance support: No upper extremity supported;Feet supported Sitting balance-Leahy Scale: Good     Standing balance support: Bilateral upper extremity supported Standing balance-Leahy Scale: Poor                              Cognition Arousal/Alertness: Awake/alert Behavior During Therapy: WFL for tasks assessed/performed Overall Cognitive Status: Within Functional Limits for tasks assessed                                        Exercises      General Comments        Pertinent Vitals/Pain Pain Assessment: 0-10 Pain Score: 5  Pain Location: R hip Pain Descriptors / Indicators: Aching Pain Intervention(s): Limited activity within patient's tolerance;Monitored during session;Premedicated before session;Ice applied    Home Living                      Prior Function            PT Goals (current goals can now be found in the care plan section) Acute Rehab PT Goals Patient Stated Goal: Regain IND PT Goal Formulation: With patient Time For Goal Achievement: 05/18/21 Potential to Achieve Goals: Good Progress towards PT goals: Progressing toward goals    Frequency    7X/week  PT Plan Current plan remains appropriate    Co-evaluation              AM-PAC PT "6 Clicks" Mobility   Outcome Measure  Help needed turning from your back to your side while in a flat bed without using bedrails?: A Little Help needed moving from lying on your back to sitting on the side of a flat bed without using bedrails?: A Little Help needed moving to and from a bed to a chair (including a wheelchair)?: A Little Help needed standing up from a chair using your arms (e.g., wheelchair or bedside chair)?: A Little Help needed to walk in hospital room?: A Little Help needed climbing 3-5 steps with a railing? : A Lot 6 Click Score:  17    End of Session Equipment Utilized During Treatment: Gait belt Activity Tolerance: Patient tolerated treatment well Patient left: in chair;with call bell/phone within reach;with chair alarm set Nurse Communication: Mobility status PT Visit Diagnosis: Difficulty in walking, not elsewhere classified (R26.2);Pain Pain - Right/Left: Left Pain - part of body: Hip     Time: 0321-2248 PT Time Calculation (min) (ACUTE ONLY): 23 min  Charges:  $Gait Training: 23-37 mins                     [place] Pager [phone] Office [phone]    Deaveon Schoen 05/12/2021, 10:45 AM

## 2021-05-12 NOTE — Progress Notes (Signed)
Physical Therapy Treatment Patient Details Name: Miranda Graham MRN: [id number] DOB: 06-11-1956 Today's Date: 05/12/2021   History of Present Illness Pt s/p L THR revision and with hx of L THR; R THR by anterior direct approach (04/02/21), and ovarian CA    PT Comments    Pt continues very cooperative but requiring increased time, progressing slowly but steadily with mobility, and fatigues easily.       Recommendations for follow up therapy are one component of a multi-disciplinary discharge planning process, led by the attending physician.  Recommendations may be updated based on patient status, additional functional criteria and insurance authorization.  Follow Up Recommendations  Follow surgeon's recommendation for DC plan and follow-up therapies     Equipment Recommendations  None recommended by PT    Recommendations for Other Services       Precautions / Restrictions Precautions Precautions: Fall;Anterior Hip Restrictions Weight Bearing Restrictions: Yes LLE Weight Bearing: Partial weight bearing LLE Partial Weight Bearing Percentage or Pounds: 50%     Mobility  Bed Mobility Overal bed mobility: Needs Assistance Bed Mobility: Supine to Sit     Supine to sit: Min guard     General bed mobility comments: Increased time with cues for sequence and use of R LE and gait belt to assist.    Transfers Overall transfer level: Needs assistance Equipment used: Rolling walker (2 wheeled) Transfers: Sit to/from Stand Sit to Stand: Min assist         General transfer comment: VCs hand placement and LE management  Ambulation/Gait Ambulation/Gait assistance: Min assist;Min guard Gait Distance (Feet): 28 Feet Assistive device: Rolling walker (2 wheeled) Gait Pattern/deviations: Step-to pattern Gait velocity: decr   General Gait Details: Increased time with cues for sequence, posture, position from RW and PWB   Stairs             Wheelchair Mobility     Modified Rankin (Stroke Patients Only)       Balance Overall balance assessment: Needs assistance Sitting-balance support: No upper extremity supported;Feet supported Sitting balance-Leahy Scale: Good     Standing balance support: Bilateral upper extremity supported Standing balance-Leahy Scale: Poor                              Cognition Arousal/Alertness: Awake/alert Behavior During Therapy: WFL for tasks assessed/performed Overall Cognitive Status: Within Functional Limits for tasks assessed                                        Exercises Total Joint Exercises Ankle Circles/Pumps: AROM;Both;20 reps;Supine Quad Sets: AROM;Right;Supine;10 reps Heel Slides: AAROM;Right;Supine;20 reps Long Arc Quad: AROM;Right;10 reps;Seated    General Comments        Pertinent Vitals/Pain Pain Assessment: 0-10 Pain Score: 5  Pain Location: R hip Pain Descriptors / Indicators: Aching Pain Intervention(s): Limited activity within patient's tolerance;Monitored during session;Premedicated before session;Ice applied    Home Living                      Prior Function            PT Goals (current goals can now be found in the care plan section) Acute Rehab PT Goals Patient Stated Goal: Regain IND PT Goal Formulation: With patient Time For Goal Achievement: 05/18/21 Potential to Achieve Goals: Good Progress towards PT goals:  Progressing toward goals    Frequency    7X/week      PT Plan Current plan remains appropriate    Co-evaluation              AM-PAC PT "6 Clicks" Mobility   Outcome Measure  Help needed turning from your back to your side while in a flat bed without using bedrails?: A Little Help needed moving from lying on your back to sitting on the side of a flat bed without using bedrails?: A Little Help needed moving to and from a bed to a chair (including a wheelchair)?: A Little Help needed standing up from a  chair using your arms (e.g., wheelchair or bedside chair)?: A Little Help needed to walk in hospital room?: A Little Help needed climbing 3-5 steps with a railing? : A Lot 6 Click Score: 17    End of Session Equipment Utilized During Treatment: Gait belt Activity Tolerance: Patient tolerated treatment well Patient left: in chair;with call bell/phone within reach;with chair alarm set Nurse Communication: Mobility status PT Visit Diagnosis: Difficulty in walking, not elsewhere classified (R26.2);Pain Pain - Right/Left: Left Pain - part of body: Hip     Time: 1017-5102 PT Time Calculation (min) (ACUTE ONLY): 45 min  Charges:  $Gait Training: 8-22 mins $Therapeutic Exercise: 8-22 mins $Therapeutic Activity: 8-22 mins                     [place] Pager [phone] Office [phone]    Miranda Graham 05/12/2021, 4:32 PM

## 2021-05-12 NOTE — Progress Notes (Signed)
   Subjective: 2 Days Post-Op Procedure(s) (LRB): TOTAL HIP REVISION-ANTERIOR APPROACH (Left) Patient reports pain as moderate.   Patient seen in rounds for Dr. Alvan Dame. Patient is sitting at the side of the bed about to have breakfast. She is in good spirits and has appreciated the help of all of the staff here. She ambulated 34 feet with PT yesterday. She reports she was having a pain in the top of her foot this morning, but this resolved after she took her muscle relaxant. Voiding without difficulty. She denies dizziness or lightheadedness.  We will continue therapy today.   Objective: Vital signs in last 24 hours: Temp:  [98.2 F (36.8 C)-100 F (37.8 C)] 98.8 F (37.1 C) (10/09 0439) Pulse Rate:  [83-96] 88 (10/09 0439) Resp:  [17-18] 18 (10/09 0439) BP: (97-141)/(56-64) 98/57 (10/09 0439) SpO2:  [95 %-98 %] 96 % (10/09 0439)  Intake/Output from previous day:  Intake/Output Summary (Last 24 hours) at 05/12/2021 0810 Last data filed at 05/12/2021 0600 Gross per 24 hour  Intake 1080 ml  Output --  Net 1080 ml     Intake/Output this shift: No intake/output data recorded.  Labs: Recent Labs    05/10/21 1146 05/10/21 1723 05/11/21 0500 05/12/21 0329  HGB 8.6* 7.2* 7.9* 7.4*   Recent Labs    05/11/21 0500 05/12/21 0329  WBC 16.4* 11.4*  RBC 2.80* 2.69*  HCT 25.1* 24.3*  PLT 387 370   Recent Labs    05/10/21 1146 05/11/21 0500  NA 138 145  K 3.7 3.1*  CL 100 106  CO2 27 28  BUN 20 18  CREATININE 0.85 0.78  GLUCOSE 83 121*  CALCIUM 9.0 9.2   No results for input(s): LABPT, INR in the last 72 hours.  Exam: General - Patient is Alert and Oriented Extremity - Neurologically intact Sensation intact distally Intact pulses distally Dorsiflexion/Plantar flexion intact Dressing - dressing C/D/I Motor Function - intact, moving foot and toes well on exam.   Past Medical History:  Diagnosis Date   Anemia    Arthritis    GERD (gastroesophageal reflux  disease)    History of kidney stones    Ovarian cancer (HCC)     Assessment/Plan: 2 Days Post-Op Procedure(s) (LRB): TOTAL HIP REVISION-ANTERIOR APPROACH (Left) Active Problems:   s/p  revision of total replacement of left hip joint   S/P revision of total hip  Estimated body mass index is 40.52 kg/m as calculated from the following:   Height as of this encounter: 5\' 2"  (1.575 m).   Weight as of this encounter: 100.5 kg. Advance diet Up with therapy  DVT Prophylaxis - Aspirin PWB LLE 50%  ABLA on chronic anemia - hgb 7.4 this AM, asymptomatic Potassium of 3.1 this AM, will give additional 40 mg [place] is to go Home after hospital stay. Plan to continue working with PT today, with likely progression to discharge home tomorrow. I do want to continue to monitor her hemoglobin for another day as well.   Griffith Citron, PA-C Orthopedic Surgery [phone] 05/12/2021, 8:10 AM

## 2021-05-12 NOTE — Anesthesia Postprocedure Evaluation (Signed)
Anesthesia Post Note  Patient: Miranda Graham  Procedure(s) Performed: TOTAL HIP REVISION-ANTERIOR APPROACH (Left: Hip)     Patient location during evaluation: PACU Anesthesia Type: Spinal Level of consciousness: oriented and awake and alert Pain management: pain level controlled Vital Signs Assessment: post-procedure vital signs reviewed and stable Respiratory status: spontaneous breathing, respiratory function stable and patient connected to nasal cannula oxygen Cardiovascular status: blood pressure returned to baseline and stable Postop Assessment: no headache, no backache and no apparent nausea or vomiting Anesthetic complications: no   No notable events documented.  Last Vitals:  Vitals:   05/11/21 2116 05/12/21 0439  BP: 129/64 (!) 98/57  Pulse: 96 88  Resp: 17 18  Temp: 37.8 C 37.1 C  SpO2: 98% 96%    Last Pain:  Vitals:   05/12/21 0439  TempSrc: Oral  PainSc: 5                  Wrenna Saks S

## 2021-05-12 NOTE — Progress Notes (Addendum)
Pt requesting something for heartburn. Caryl Pina, [place] made aware. New order given for TUMS.  VWilliams, Therapist, sports.

## 2021-05-13 ENCOUNTER — Encounter (HOSPITAL_COMMUNITY): Payer: Self-pay | Admitting: Orthopedic Surgery

## 2021-05-13 LAB — TYPE AND SCREEN
ABO/RH(D): A POS
Antibody Screen: NEGATIVE
Unit division: 0

## 2021-05-13 LAB — CBC
HCT: 23.9 % — ABNORMAL LOW (ref 36.0–46.0)
Hemoglobin: 7.2 g/dL — ABNORMAL LOW (ref 12.0–15.0)
MCH: 27.5 pg (ref 26.0–34.0)
MCHC: 30.1 g/dL (ref 30.0–36.0)
MCV: 91.2 fL (ref 80.0–100.0)
Platelets: 378 10*3/uL (ref 150–400)
RBC: 2.62 MIL/uL — ABNORMAL LOW (ref 3.87–5.11)
RDW: 17.2 % — ABNORMAL HIGH (ref 11.5–15.5)
WBC: 10 10*3/uL (ref 4.0–10.5)
nRBC: 0.2 % (ref 0.0–0.2)

## 2021-05-13 LAB — BPAM RBC
Blood Product Expiration Date: 202210302359
ISSUE DATE / TIME: 202210080007
Unit Type and Rh: 6200

## 2021-05-13 MED ORDER — POTASSIUM CHLORIDE CRYS ER 20 MEQ PO TBCR
40.0000 meq | EXTENDED_RELEASE_TABLET | Freq: Once | ORAL | Status: AC
  Administered 2021-05-13: 40 meq via ORAL
  Filled 2021-05-13: qty 2

## 2021-05-13 NOTE — Progress Notes (Signed)
Physical Therapy Treatment Patient Details Name: Miranda Graham MRN: [id number] DOB: 10/10/1955 Today's Date: 05/13/2021   History of Present Illness Pt s/p L THR revision and with hx of L THR; R THR by anterior direct approach (04/02/21), and ovarian CA    PT Comments    POD # 1 pm session Pt was OOB in recliner and did amb to and from bathroom this morning with NT.  Pt fearful as she shared "I almost fell" when stepping in threshold.   General Gait Details: Increased time with cues for sequence, posture, position from RW and PWB 50% VC's on proper walker to self distance as pt tends to push out too far front. General transfer comment: increased time to transition.  Also assisted with a toilet transfer.  Unsteady while donning/doffing underpants. General bed mobility comments: assisted back to bed supporting B LE and adjusted to comfort.  Performed a few TE's followed by ICE. Pt plans to D/C to her sister's.    Recommendations for follow up therapy are one component of a multi-disciplinary discharge planning process, led by the attending physician.  Recommendations may be updated based on patient status, additional functional criteria and insurance authorization.  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 Restrictions Weight Bearing Restrictions: Yes LLE Weight Bearing: Partial weight bearing LLE Partial Weight Bearing Percentage or Pounds: 50%     Mobility  Bed Mobility Overal bed mobility: Needs Assistance Bed Mobility: Sit to Supine       Sit to supine: Mod assist   General bed mobility comments: assisted back to bed supporting B LE and adjusted to comfort     Transfers Overall transfer level: Needs assistance Equipment used: Rolling walker (2 wheeled) Transfers: Sit to/from [employer] Sit to Stand: Min assist Stand pivot transfers: Min assist       General transfer comment: increased time to transition.  Also assisted with a toilet transfer.  Unsteady while donning/doffing underpants.  Ambulation/Gait Ambulation/Gait assistance: Min assist;Min guard Gait Distance (Feet): 24 Feet (12 feet x 2 to and from bathroom) Assistive device: Rolling walker (2 wheeled) Gait Pattern/deviations: Step-to pattern Gait velocity: decr   General Gait Details: Increased time with cues for sequence, posture, position from RW and PWB 50% VC's on proper walker to self distance as pt tends to push out too far front.   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  Exercises      General Comments        Pertinent Vitals/Pain Pain Assessment: Faces Faces Pain Scale: Hurts even more Pain Location: R hip Pain Descriptors / Indicators: Discomfort;Grimacing;Guarding;Operative site guarding Pain Intervention(s): Monitored during session;Repositioned;Ice applied    Home Living                      Prior Function            PT Goals (current goals can now be found in the care plan section) Progress towards PT goals: Progressing toward goals    Frequency    7X/week      PT Plan Current plan remains appropriate    Co-evaluation              AM-PAC PT "6  Clicks" Mobility   Outcome Measure  Help needed turning from your back to your side while in a flat bed without using bedrails?: A Little Help needed moving from lying on your back to sitting on the side of a flat bed without using bedrails?: A Little Help needed moving to and from a bed to a chair (including a wheelchair)?: A Little Help needed standing up from a chair using your arms (e.g., wheelchair or bedside chair)?: A Little Help needed to walk in hospital room?: A Little Help needed climbing 3-5 steps with a railing? : A Lot 6 Click Score: 17    End of Session Equipment Utilized During Treatment: Gait belt Activity Tolerance: Patient tolerated treatment well Patient left: with call bell/phone within reach;in bed;with bed alarm set Nurse Communication: Mobility status PT Visit Diagnosis: Difficulty in walking, not elsewhere classified (R26.2);Pain Pain - Right/Left: Left Pain - part of body: Hip     Time: 1410-1436 PT Time Calculation (min) (ACUTE ONLY): 26 min  Charges:  $Gait Training: 8-22 mins $Therapeutic Activity: 8-22 mins                     Rica Koyanagi  PTA Acute  Rehabilitation Services Pager      [phone] Office      [phone]

## 2021-05-13 NOTE — Plan of Care (Signed)
  Problem: Education: Goal: Knowledge of General Education information will improve Description: Including pain rating scale, medication(s)/side effects and non-pharmacologic comfort measures Outcome: Progressing   Problem: Health Behavior/Discharge Planning: Goal: Ability to manage health-related needs will improve Outcome: Progressing   Problem: Clinical Measurements: Goal: Ability to maintain clinical measurements within normal limits will improve Outcome: Progressing   Problem: Nutrition: Goal: Adequate nutrition will be maintained Outcome: Progressing   Problem: Coping: Goal: Level of anxiety will decrease Outcome: Progressing   Problem: Elimination: Goal: Will not experience complications related to bowel motility Outcome: Progressing   Problem: Pain Managment: Goal: General experience of comfort will improve Outcome: Progressing   Problem: Safety: Goal: Ability to remain free from injury will improve Outcome: Progressing

## 2021-05-13 NOTE — TOC Transition Note (Signed)
Transition of Care [facility]) - CM/SW Discharge Note  Patient Details  Name: Miranda Graham MRN: [id number] Date of Birth: 1955-11-13  Transition of [facility] CM/SW Contact:  Sherie Don, LCSW Phone Number: 05/13/2021, 1:53 PM  Clinical Narrative: Patient is expected to discharge home tomorrow after working with PT. CSW spoke with patient to confirm discharge plan. Patient will discharge home with a home exercise program (HEP). Patient has a rolling walker, 3N1, and elevated toilet seat at home so there are no DME needs at this time. TOC signing off.  Final next level of care: Home/Self Care Barriers to Discharge: No Barriers Identified  Patient Goals and CMS Choice Patient states their goals for this hospitalization and ongoing recovery are:: Discharge home with HEP CMS Medicare.gov Compare Post Acute Care list provided to:: Patient Choice offered to / list presented to : Patient  Discharge Plan and Services         DME Arranged: N/A DME Agency: NA  Readmission Risk Interventions No flowsheet data found.

## 2021-05-13 NOTE — Care Management Important Message (Signed)
Important Message  Patient Details IM Letter given to the Patient. Name: Miranda Graham MRN: [id number] Date of Birth: 11-Nov-1955   Medicare Important Message Given:  Yes     Kerin Salen 05/13/2021, 3:03 PM

## 2021-05-13 NOTE — Progress Notes (Signed)
   Subjective: 3 Days Post-Op Procedure(s) (LRB): TOTAL HIP REVISION-ANTERIOR APPROACH (Left) Patient reports pain as mild.   Patient seen in rounds for Dr. Alvan Dame. Patient is resting in bed on exam this morning. She reports she had a good day yesterday and can tell she is making progress. She does want to be sure she can get in the house and ambulate okay as her brother/inlaw will be coming home from rehab as well and needs more assistance.  We will continue  therapy today.   Objective: Vital signs in last 24 hours: Temp:  [97.7 F (36.5 C)-100.6 F (38.1 C)] 97.7 F (36.5 C) (10/10 0522) Pulse Rate:  [85-92] 85 (10/10 0522) Resp:  [14-18] 18 (10/10 0522) BP: (100-118)/(51-56) 116/56 (10/10 0522) SpO2:  [94 %-100 %] 100 % (10/10 0522)  Intake/Output from previous day:  Intake/Output Summary (Last 24 hours) at 05/13/2021 0738 Last data filed at 05/12/2021 2039 Gross per 24 hour  Intake 900 ml  Output --  Net 900 ml     Intake/Output this shift: No intake/output data recorded.  Labs: Recent Labs    05/10/21 1146 05/10/21 1723 05/11/21 0500 05/12/21 0329  HGB 8.6* 7.2* 7.9* 7.4*   Recent Labs    05/11/21 0500 05/12/21 0329  WBC 16.4* 11.4*  RBC 2.80* 2.69*  HCT 25.1* 24.3*  PLT 387 370   Recent Labs    05/10/21 1146 05/11/21 0500  NA 138 145  K 3.7 3.1*  CL 100 106  CO2 27 28  BUN 20 18  CREATININE 0.85 0.78  GLUCOSE 83 121*  CALCIUM 9.0 9.2   No results for input(s): LABPT, INR in the last 72 hours.  Exam: General - Patient is Alert and Oriented Extremity - Neurologically intact Sensation intact distally Intact pulses distally Dorsiflexion/Plantar flexion intact Dressing - dressing C/D/I Motor Function - intact, moving foot and toes well on exam.   Past Medical History:  Diagnosis Date   Anemia    Arthritis    GERD (gastroesophageal reflux disease)    History of kidney stones    Ovarian cancer (HCC)     Assessment/Plan: 3 Days Post-Op  Procedure(s) (LRB): TOTAL HIP REVISION-ANTERIOR APPROACH (Left) Active Problems:   s/p  revision of total replacement of left hip joint   S/P revision of total hip  Estimated body mass index is 40.52 kg/m as calculated from the following:   Height as of this encounter: 5\' 2"  (1.575 m).   Weight as of this encounter: 100.5 kg. Advance diet Up with therapy  DVT Prophylaxis - Aspirin PWB 50% LLE  ABLA on chronic anemia - will recheck CBC this AM  Plan is to go Home after hospital stay. Plan for possible discharge home today after 2 sessions of therapy if she is meeting her goals and hgb is stable. Follow up in the office as scheduled.   Griffith Citron, PA-C Orthopedic Surgery [phone] 05/13/2021, 7:38 AM

## 2021-05-14 LAB — BASIC METABOLIC PANEL
Anion gap: 8 (ref 5–15)
BUN: 12 mg/dL (ref 8–23)
CO2: 29 mmol/L (ref 22–32)
Calcium: 8.8 mg/dL — ABNORMAL LOW (ref 8.9–10.3)
Chloride: 101 mmol/L (ref 98–111)
Creatinine, Ser: 0.49 mg/dL (ref 0.44–1.00)
GFR, Estimated: >60 mL/min (ref >60)
Glucose, Bld: 116 mg/dL — ABNORMAL HIGH (ref 70–99)
Potassium: 3.9 mmol/L (ref 3.5–5.1)
Sodium: 138 mmol/L (ref 135–145)

## 2021-05-14 LAB — CBC
HCT: 22.4 % — ABNORMAL LOW (ref 36.0–46.0)
Hemoglobin: 6.9 g/dL — CL (ref 12.0–15.0)
MCH: 27.6 pg (ref 26.0–34.0)
MCHC: 30.8 g/dL (ref 30.0–36.0)
MCV: 89.6 fL (ref 80.0–100.0)
Platelets: 394 10*3/uL (ref 150–400)
RBC: 2.5 MIL/uL — ABNORMAL LOW (ref 3.87–5.11)
RDW: 17.2 % — ABNORMAL HIGH (ref 11.5–15.5)
WBC: 9.4 10*3/uL (ref 4.0–10.5)
nRBC: 0 % (ref 0.0–0.2)

## 2021-05-14 LAB — PREPARE RBC (CROSSMATCH)

## 2021-05-14 LAB — HEMOGLOBIN AND HEMATOCRIT, BLOOD
HCT: 28.2 % — ABNORMAL LOW (ref 36.0–46.0)
Hemoglobin: 8.7 g/dL — ABNORMAL LOW (ref 12.0–15.0)

## 2021-05-14 MED ORDER — HYDROCODONE-ACETAMINOPHEN 7.5-325 MG PO TABS: 1.0000 | ORAL_TABLET | Freq: Four times a day (QID) | ORAL | 0 refills | Status: DC | PRN

## 2021-05-14 MED ORDER — METHOCARBAMOL 500 MG PO TABS: 500.0000 mg | ORAL_TABLET | Freq: Four times a day (QID) | ORAL | 0 refills | Status: DC | PRN

## 2021-05-14 MED ORDER — ASPIRIN 81 MG PO CHEW: 81.0000 mg | CHEWABLE_TABLET | Freq: Two times a day (BID) | ORAL | 0 refills | Status: AC

## 2021-05-14 MED ORDER — FERROUS SULFATE 325 (65 FE) MG PO TABS: 325.0000 mg | ORAL_TABLET | Freq: Three times a day (TID) | ORAL | 0 refills | Status: AC

## 2021-05-14 MED ORDER — SODIUM CHLORIDE 0.9% IV SOLUTION: Freq: Once | INTRAVENOUS | Status: AC

## 2021-05-14 NOTE — Progress Notes (Signed)
   Subjective: 4 Days Post-Op Procedure(s) (LRB): TOTAL HIP REVISION-ANTERIOR APPROACH (Left) Patient reports pain as mild.   Patient seen in rounds with Dr. Alvan Dame. Patient is resting in bed this morning on exam. She reports she is doing well and has improved a lot since her surgery. Ambulated 24 feet with PT yesterday.  We will continue therapy today.   Objective: Vital signs in last 24 hours: Temp:  [97.8 F (36.6 C)-98.8 F (37.1 C)] 97.8 F (36.6 C) (10/11 0548) Pulse Rate:  [86-91] 86 (10/11 0548) Resp:  [18-20] 20 (10/11 0548) BP: (107-119)/(48-61) 119/59 (10/11 0548) SpO2:  [94 %-97 %] 96 % (10/11 0548)  Intake/Output from previous day:  Intake/Output Summary (Last 24 hours) at 05/14/2021 1047 Last data filed at 05/14/2021 1046 Gross per 24 hour  Intake 680 ml  Output --  Net 680 ml     Intake/Output this shift: Total I/O In: 120 [P.O.:120] Out: -   Labs: Recent Labs    05/12/21 0329 05/13/21 0743 05/14/21 0319  HGB 7.4* 7.2* 6.9*   Recent Labs    05/13/21 0743 05/14/21 0319  WBC 10.0 9.4  RBC 2.62* 2.50*  HCT 23.9* 22.4*  PLT 378 394   Recent Labs    05/14/21 0319  NA 138  K 3.9  CL 101  CO2 29  BUN 12  CREATININE 0.49  GLUCOSE 116*  CALCIUM 8.8*   No results for input(s): LABPT, INR in the last 72 hours.  Exam: General - Patient is Alert and Oriented Extremity - Neurologically intact Sensation intact distally Intact pulses distally Dorsiflexion/Plantar flexion intact Dressing - dressing C/D/I Motor Function - intact, moving foot and toes well on exam.   Past Medical History:  Diagnosis Date   Anemia    Arthritis    GERD (gastroesophageal reflux disease)    History of kidney stones    Ovarian cancer (HCC)     Assessment/Plan: 4 Days Post-Op Procedure(s) (LRB): TOTAL HIP REVISION-ANTERIOR APPROACH (Left) Active Problems:   s/p  revision of total replacement of left hip joint   S/P revision of total hip  Estimated body  mass index is 40.52 kg/m as calculated from the following:   Height as of this encounter: 5\' 2"  (1.575 m).   Weight as of this encounter: 100.5 kg. Advance diet Up with therapy D/C IV fluids  DVT Prophylaxis - Aspirin PWB LLE 50%  ABLA on chronic anemia - hgb 6.9 this AM. Discussed with patient, and she does wish to proceed with transfusion. Will transfuse 1 unit today.  Plan is to go Home after hospital stay. Plan for discharge home after receiving 1 unit of PRBC today. Up with PT today as able. Follow up in the office as scheduled for 2 week post op.   Griffith Citron, PA-C Orthopedic Surgery [phone] 05/14/2021, 10:47 AM

## 2021-05-14 NOTE — Progress Notes (Signed)
Physical Therapy Treatment Patient Details Name: Miranda Graham MRN: [id number] DOB: 1955-12-26 Today's Date: 05/14/2021   History of Present Illness Pt s/p L THR revision and with hx of L THR; R THR by anterior direct approach (04/02/21), and ovarian CA    PT Comments    POD # 4 pm session Pt complete her one unit of blood.  General Gait Details: pt only amb 6 feet with Therapist however she did amb to and from bathroom "4 times" with nursing staff to void due to LASIX.  Max c/o fatigue and unable to practice stairs. Assisted back to bed and performed a few TE's followed by ICE. Pt did NOT meet her PT goals of mobility/stairs to D/C today   Recommendations for follow up therapy are one component of a multi-disciplinary discharge planning process, led by the attending physician.  Recommendations may be updated based on patient status, additional functional criteria and insurance authorization.  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 Restrictions Weight Bearing Restrictions: Yes LLE Weight Bearing: Partial weight bearing LLE Partial Weight Bearing Percentage or Pounds: 50%     Mobility  Bed Mobility Overal bed mobility: Needs Assistance Bed Mobility: Sit to Supine       Sit to supine: Max assist   General bed mobility comments: Max Assist to suppoer b LE up onto bed and great difficulty scooting to middle of bed.    Transfers Overall transfer level: Needs assistance Equipment used: Rolling walker (2 wheeled) Transfers: Sit to/from [facility] Sit to Stand: Min assist Stand pivot transfers: Min assist       General transfer comment:  increased time to transition.  Ambulation/Gait Ambulation/Gait assistance: Supervision;Min guard Gait Distance (Feet): 6 Feet Assistive device: Rolling walker (2 wheeled) Gait Pattern/deviations: Step-to pattern Gait velocity: decr   General Gait Details: pt only amb 6 feet with Therapist however she did amb to and from bathroom "4 times" with nursing staff to void due to LASIX.  Max c/o fatigue and unable to practice stairs.   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      Exercises      General Comments        Pertinent Vitals/Pain Pain Assessment: 0-10 Pain Score: 8  Pain Location: R hip Pain Descriptors / Indicators: Discomfort;Grimacing;Guarding;Operative site guarding Pain Intervention(s): Monitored during session;Repositioned;Patient requesting pain meds-RN notified;Ice applied    Home Living  Prior Function            PT Goals (current goals can now be found in the care plan section) Progress towards PT goals: Progressing toward goals    Frequency    7X/week      PT Plan Current plan remains appropriate    Co-evaluation              AM-PAC PT "6 Clicks" Mobility   Outcome Measure  Help needed turning from your back to your side while in a flat bed without using bedrails?: A Little Help needed moving from lying on your back to sitting on the side of a flat bed without using bedrails?: A Little Help needed moving to and from a bed to a chair (including a wheelchair)?: A  Little Help needed standing up from a chair using your arms (e.g., wheelchair or bedside chair)?: A Little Help needed to walk in hospital room?: A Little Help needed climbing 3-5 steps with a railing? : A Little 6 Click Score: 18    End of Session Equipment Utilized During Treatment: Gait belt Activity Tolerance: Patient tolerated treatment well Patient left: with call bell/phone within reach;in bed;with bed alarm set Nurse Communication: Mobility status PT Visit Diagnosis: Difficulty in walking, not elsewhere classified (R26.2);Pain Pain - Right/Left: Left Pain - part of body: Hip     Time: 1435-1500 PT Time Calculation (min) (ACUTE ONLY): 25 min  Charges:  $Gait Training: 8-22 mins $Therapeutic Exercise: 8-22 mins                     {Donye Campanelli  PTA Acute  Rehabilitation Services Pager      [phone] Office      [phone]

## 2021-05-14 NOTE — Progress Notes (Signed)
Notified NP Elizabeth Sauer about hgb 6.9 this am.  She will report to oncoming MD to address it. No further orders.

## 2021-05-15 LAB — CBC
HCT: 26.6 % — ABNORMAL LOW (ref 36.0–46.0)
Hemoglobin: 8.3 g/dL — ABNORMAL LOW (ref 12.0–15.0)
MCH: 28 pg (ref 26.0–34.0)
MCHC: 31.2 g/dL (ref 30.0–36.0)
MCV: 89.9 fL (ref 80.0–100.0)
Platelets: 434 10*3/uL — ABNORMAL HIGH (ref 150–400)
RBC: 2.96 MIL/uL — ABNORMAL LOW (ref 3.87–5.11)
RDW: 16.5 % — ABNORMAL HIGH (ref 11.5–15.5)
WBC: 8.5 10*3/uL (ref 4.0–10.5)
nRBC: 0 % (ref 0.0–0.2)

## 2021-05-15 LAB — TYPE AND SCREEN
ABO/RH(D): A POS
Antibody Screen: NEGATIVE
Unit division: 0

## 2021-05-15 LAB — BPAM RBC
Blood Product Expiration Date: 202211042359
ISSUE DATE / TIME: 202210111214
Unit Type and Rh: 6200

## 2021-05-15 NOTE — Progress Notes (Signed)
Physical Therapy Treatment Patient Details Name: Miranda Graham MRN: [id number] DOB: October 01, 1955 Today's Date: 05/15/2021   History of Present Illness Pt s/p L THR revision and with hx of L THR; R THR by anterior direct approach (04/02/21), and ovarian CA    PT Comments    POD # 5 am session Pt was OOB in recliner.  Eager to go home today. Slept better "less bathroom trips".  Assisted with amb a functional distance then practiced one step. General stair comments: up backward due to PWB.  50% VC's on proper tech and walker placement. Then returned to room to perform some TE's following HEP handout.  Instructed on proper tech, freq as well as use of ICE.   Pt aware of her "Anterior" Hip Precautions. Addressed all mobility questions, discussed appropriate activity, educated on use of ICE.  Pt ready for D/C to home.   Recommendations for follow up therapy are one component of a multi-disciplinary discharge planning process, led by the attending physician.  Recommendations may be updated based on patient status, additional functional criteria and insurance authorization.  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 Restrictions Weight Bearing Restrictions: Yes LLE Weight Bearing: Partial weight bearing LLE Partial Weight Bearing Percentage or Pounds: 50%     Mobility  Bed Mobility               General bed mobility comments: OOB in recliner    Transfers Overall transfer level: Needs assistance Equipment used: Rolling walker (2 wheeled) Transfers: Sit to/from [facility] Sit to Stand: Supervision Stand pivot transfers: Supervision       General  transfer comment: increased time to transition.  Good safety cognition.  Ambulation/Gait Ambulation/Gait assistance: Supervision Gait Distance (Feet): 12 Feet   Gait Pattern/deviations: Step-to pattern Gait velocity: decr   General Gait Details: tolerated a functional distance.  Increased time but does well to stay 50% WBing.   Stairs Stairs: Yes Stairs assistance: Min guard Stair Management: No rails;Backwards Number of Stairs: 1 General stair comments: up backward due to PWB.  50% VC's on proper tech and walker placement.   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      Exercises  Total Hip Replacement TE's following HEP Handout 10 reps ankle pumps 05 reps knee presses 05 reps heel slides 05 reps SAQ's 05 reps ABD Instructed how to use a belt loop to assist  Followed by ICE     General Comments  Pertinent Vitals/Pain Pain Assessment: 0-10 Pain Score: 4  Pain Location: R hip Pain Descriptors / Indicators: Discomfort;Grimacing;Guarding;Operative site guarding Pain Intervention(s): Repositioned;Ice applied;Premedicated before session    Home Living                      Prior Function            PT Goals (current goals can now be found in the care plan section) Progress towards PT goals: Progressing toward goals    Frequency    7X/week      PT Plan Current plan remains appropriate    Co-evaluation              AM-PAC PT "6 Clicks" Mobility   Outcome Measure  Help needed turning from your back to your side  while in a flat bed without using bedrails?: A Little Help needed moving from lying on your back to sitting on the side of a flat bed without using bedrails?: A Little Help needed moving to and from a bed to a chair (including a wheelchair)?: A Little Help needed standing up from a chair using your arms (e.g., wheelchair or bedside chair)?: A Little Help needed to walk in hospital room?: A Little Help needed climbing 3-5 steps with a railing? : A Little 6 Click Score: 18    End of Session Equipment Utilized During Treatment: Gait belt Activity Tolerance: Patient tolerated treatment well Patient left: in chair;with call bell/phone within reach Nurse Communication: Mobility status (pt ready for D/C) PT Visit Diagnosis: Difficulty in walking, not elsewhere classified (R26.2);Pain Pain - Right/Left: Left Pain - part of body: Hip     Time: 1010-1034 PT Time Calculation (min) (ACUTE ONLY): 24 min  Charges:  $Gait Training: 8-22 mins $Therapeutic Exercise: 8-22 mins                     Rica Koyanagi  PTA Acute  Rehabilitation Services Pager      [phone] Office      [phone]

## 2021-05-15 NOTE — Progress Notes (Addendum)
   Subjective: 5 Days Post-Op Procedure(s) (LRB): TOTAL HIP REVISION-ANTERIOR APPROACH (Left) Patient reports pain as mild.   Patient seen in rounds for Dr. Alvan Dame. Patient is resting in bed this morning on exam. She received 1 unit of blood yesterday. Ambulated with PT yesterday, but did not do stairs.  We will continue therapy today.   Objective: Vital signs in last 24 hours: Temp:  [97.9 F (36.6 C)-98.5 F (36.9 C)] 98.5 F (36.9 C) (10/12 0441) Pulse Rate:  [86-88] 87 (10/12 0441) Resp:  [16-20] 20 (10/12 0441) BP: (107-120)/(45-68) 112/60 (10/12 0441) SpO2:  [92 %-100 %] 93 % (10/12 0441)  Intake/Output from previous day:  Intake/Output Summary (Last 24 hours) at 05/15/2021 0845 Last data filed at 05/15/2021 0600 Gross per 24 hour  Intake 670.83 ml  Output 450 ml  Net 220.83 ml     Intake/Output this shift: No intake/output data recorded.  Labs: Recent Labs    05/13/21 0743 05/14/21 0319 05/14/21 1643 05/15/21 0756  HGB 7.2* 6.9* 8.7* 8.3*   Recent Labs    05/14/21 0319 05/14/21 1643 05/15/21 0756  WBC 9.4  --  8.5  RBC 2.50*  --  2.96*  HCT 22.4* 28.2* 26.6*  PLT 394  --  434*   Recent Labs    05/14/21 0319  NA 138  K 3.9  CL 101  CO2 29  BUN 12  CREATININE 0.49  GLUCOSE 116*  CALCIUM 8.8*   No results for input(s): LABPT, INR in the last 72 hours.  Exam: General - Patient is Alert and Oriented Extremity - Neurologically intact Sensation intact distally Intact pulses distally Dorsiflexion/Plantar flexion intact Dressing - dressing C/D/I Motor Function - intact, moving foot and toes well on exam.   Past Medical History:  Diagnosis Date   Anemia    Arthritis    GERD (gastroesophageal reflux disease)    History of kidney stones    Ovarian cancer (HCC)     Assessment/Plan: 5 Days Post-Op Procedure(s) (LRB): TOTAL HIP REVISION-ANTERIOR APPROACH (Left) Active Problems:   s/p  revision of total replacement of left hip joint   S/P  revision of total hip  Estimated body mass index is 40.52 kg/m as calculated from the following:   Height as of this encounter: 5\' 2"  (1.575 m).   Weight as of this encounter: 100.5 kg. Advance diet Up with therapy D/C IV fluids  DVT Prophylaxis - Aspirin PWB LLE 50%  ABLA on chronic anemia - s/p 1 unit yesterday, hgb 8.3 this AM Will go home on PO iron  Plan is to go Home after hospital stay. Plan to get up with PT today to practice stairs. Hopeful discharge home after therapy. Follow up in the office as previously scheduled.   Griffith Citron, PA-C Orthopedic Surgery [phone] 05/15/2021, 8:45 AM

## 2021-05-23 NOTE — Discharge Summary (Signed)
Physician Discharge Summary   Patient ID: Miranda Graham MRN: [id number] DOB/AGE: [birth year] 67 y.o.  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  Admission Diagnoses:  Past Medical History:  Diagnosis Date   Anemia    Arthritis    GERD (gastroesophageal reflux disease)    History of kidney stones    Ovarian cancer [facility])    Discharge Diagnoses:   Active Problems:   s/p  revision of total replacement of left hip joint   S/P revision of total hip  Estimated body mass index is 40.52 kg/m as calculated from the following:   Height as of this encounter: 5\' 2"  (1.575 m).   Weight as of this encounter: 100.5 kg.  Procedure:  Procedure(s) (LRB): TOTAL HIP REVISION-ANTERIOR APPROACH (Left)   Consults: None  HPI: The patient is a very pleasant 65 year old female with history of left total hip arthroplasty that was complicated by recurrent episodes of instability.  She had been seen and evaluated for revision of this.  We had  previously addressed a significantly arthritic right hip with a total hip arthroplasty.  We are hoping to get her some period of time of recovery prior to proceeding.  However, she had a recurrent episode of dislocation of her left hip during the  postoperative period from her right hip from September.  Based on this, I tried to get her on to the operating room schedule sooner than later to try to help prevent this from recurring due to the pain and inconvenience associated with it.   We had a lengthy discussion about the condition of her left hip and the fracture of her greater trochanter and the medialization of the trochanter, resulting in an inherent abductor weakness.  I felt that we had a chance with newer technology of the dual  mobility cup and liner systems to enhance support to the hip also by adding  length.  This would allow me also to revise her acetabular shell and take some of the abduction out of it and then hopefully allow for bony ingrowth in case there was recurrent  instability, where a constrained liner would be of value.  I did not feel that converting her straight to a constrained liner would be in her best interest given the position of the cup and the potential for recurrent levels of stress resulting in  polyethylene dislocation.  Consent was obtained after reviewing the risk of infection, DVT, recurrent dislocation, neurovascular injury and the need for future surgeries.  She was amenable to the plan and consent was obtained.  Laboratory Data: Admission on 05/10/2021, Discharged on 05/15/2021  Component Date Value Ref Range Status   WBC 05/10/2021 8.1  4.0 - 10.5 K/uL Final   RBC 05/10/2021 3.21 (A)  3.87 - 5.11 MIL/uL Final   Hemoglobin 05/10/2021 8.6 (A)  12.0 - 15.0 g/dL Final   HCT 05/10/2021 29.1 (A)  36.0 - 46.0 % Final   MCV 05/10/2021 90.7  80.0 - 100.0 fL Final   MCH 05/10/2021 26.8  26.0 - 34.0 pg Final   MCHC 05/10/2021 29.6 (A)  30.0 - 36.0 g/dL Final   RDW 05/10/2021 17.8 (A)  11.5 - 15.5 % Final   Platelets 05/10/2021 172  150 - 400 K/uL Final   nRBC 05/10/2021 0.0  0.0 - 0.2 % Final   Performed at [facility], [place]  [address], [place], [place] [address]   Sodium 05/10/2021 138  135 - 145 mmol/L Final   Potassium 05/10/2021 3.7  3.5 - 5.1 mmol/L Final   MODERATE HEMOLYSIS   Chloride 05/10/2021 100  98 - 111 mmol/L Final   CO2 05/10/2021 27  22 - 32 mmol/L Final   Glucose, Bld 05/10/2021 83  70 - 99 mg/dL Final   Glucose reference range applies only to samples taken after fasting for at least 8 hours.   BUN 05/10/2021 20  8 - 23 mg/dL Final   Creatinine, Ser 05/10/2021 0.85  0.44 - 1.00 mg/dL Final   Calcium 05/10/2021 9.0  8.9 - 10.3 mg/dL Final   Total Protein 05/10/2021 7.0  6.5 - 8.1 g/dL Final   Albumin 05/10/2021 3.6  3.5 - 5.0  g/dL Final   AST 05/10/2021 33  15 - 41 U/L Final   ALT 05/10/2021 13  0 - 44 U/L Final   Alkaline Phosphatase 05/10/2021 77  38 - 126 U/L Final   Total Bilirubin 05/10/2021 0.6  0.3 - 1.2 mg/dL Final   GFR, Estimated 05/10/2021 >60  >60 mL/min Final   Comment: (NOTE) Calculated using the CKD-EPI Creatinine Equation (2021)    Anion gap 05/10/2021 11  5 - 15 Final   Performed at [facility], [place] [address] [place], [place] [address]   ABO/RH(D) 05/10/2021 A POS   Final   Antibody Screen 05/10/2021 NEG   Final   Sample Expiration 05/10/2021 05/13/2021,2359   Final   Unit Number 05/10/2021 [id number]   Final   Blood Component Type 05/10/2021 RED CELLS,LR   Final   Unit division 05/10/2021 00   Final   Status of Unit 05/10/2021 [facility]   Final   Transfusion Status 05/10/2021 OK TO TRANSFUSE   Final   Crossmatch Result 05/10/2021    Final                   Value:Compatible Performed at [facility], [place] [name] [place], [place] [address]    Hemoglobin 05/10/2021 7.2 (A)  12.0 - 15.0 g/dL Final   HCT 05/10/2021 24.3 (A)  36.0 - 46.0 % Final   Performed at [facility], [place] [address], [place], [place] [address]   Order Confirmation 05/10/2021    Final                   Value:ORDER PROCESSED BY BLOOD BANK Performed at [facility], [place] [address] [place], [place] [address]    ISSUE DATE / TIME 05/10/2021 [id number]   Final   Blood Product Unit Number 05/10/2021 [id number]   Final   PRODUCT CODE 05/10/2021 [id number]   Final   Unit Type and Rh 05/10/2021 6200   Final   Blood Product Expiration Date 05/10/2021 322025427062   Final   WBC 05/11/2021 16.4 (A)  4.0 - 10.5 K/uL Final   RBC 05/11/2021 2.80 (A)  3.87 - 5.11 MIL/uL Final   Hemoglobin 05/11/2021 7.9 (A)  12.0 - 15.0 g/dL Final   HCT 05/11/2021 25.1 (A)  36.0 - 46.0 % Final   MCV 05/11/2021 89.6  80.0 - 100.0 fL Final   MCH 05/11/2021  28.2  26.0 - 34.0 pg Final   MCHC 05/11/2021 31.5  30.0 - 36.0 g/dL Final   RDW 05/11/2021 16.6 (A)  11.5 - 15.5 % Final   Platelets 05/11/2021 387  150 - 400 K/uL Final   nRBC 05/11/2021  0.0  0.0 - 0.2 % Final   Performed at [place] [address] [place], [place] [address]   Sodium 05/11/2021 145  135 - 145 mmol/L Final   DELTA CHECK NOTED   Potassium 05/11/2021 3.1 (A)  3.5 - 5.1 mmol/L Final   Chloride 05/11/2021 106  98 - 111 mmol/L Final   CO2 05/11/2021 28  22 - 32 mmol/L Final   Glucose, Bld 05/11/2021 121 (A)  70 - 99 mg/dL Final   Glucose reference range applies only to samples taken after fasting for at least 8 hours.   BUN 05/11/2021 18  8 - 23 mg/dL Final   Creatinine, Ser 05/11/2021 0.78  0.44 - 1.00 mg/dL Final   Calcium 05/11/2021 9.2  8.9 - 10.3 mg/dL Final   GFR, Estimated 05/11/2021 >60  >60 mL/min Final   Comment: (NOTE) Calculated using the CKD-EPI Creatinine Equation (2021)    Anion gap 05/11/2021 11  5 - 15 Final   Performed at [facility], [place] [address], [place], [place] [address]   WBC 05/12/2021 11.4 (A)  4.0 - 10.5 K/uL Final   RBC 05/12/2021 2.69 (A)  3.87 - 5.11 MIL/uL Final   Hemoglobin 05/12/2021 7.4 (A)  12.0 - 15.0 g/dL Final   HCT 05/12/2021 24.3 (A)  36.0 - 46.0 % Final   MCV 05/12/2021 90.3  80.0 - 100.0 fL Final   MCH 05/12/2021 27.5  26.0 - 34.0 pg Final   MCHC 05/12/2021 30.5  30.0 - 36.0 g/dL Final   RDW 05/12/2021 17.3 (A)  11.5 - 15.5 % Final   Platelets 05/12/2021 370  150 - 400 K/uL Final   nRBC 05/12/2021 0.0  0.0 - 0.2 % Final   Performed at [facility], [place] [address] [place], [place] [address]   WBC 05/13/2021 10.0  4.0 - 10.5 K/uL Final   RBC 05/13/2021 2.62 (A)  3.87 - 5.11 MIL/uL Final   Hemoglobin 05/13/2021 7.2 (A)  12.0 - 15.0 g/dL Final   HCT 05/13/2021 23.9 (A)  36.0 - 46.0 % Final   MCV 05/13/2021 91.2  80.0 - 100.0 fL Final   MCH 05/13/2021 27.5  26.0 -  34.0 pg Final   MCHC 05/13/2021 30.1  30.0 - 36.0 g/dL Final   RDW 05/13/2021 17.2 (A)  11.5 - 15.5 % Final   Platelets 05/13/2021 378  150 - 400 K/uL Final   nRBC 05/13/2021 0.2  0.0 - 0.2 % Final   Performed at [facility], [place] [address], [place], [place] [address]   Sodium 05/14/2021 138  135 - 145 mmol/L Final   Potassium 05/14/2021 3.9  3.5 - 5.1 mmol/L Final   Chloride 05/14/2021 101  98 - 111 mmol/L Final   CO2 05/14/2021 29  22 - 32 mmol/L Final   Glucose, Bld 05/14/2021 116 (A)  70 - 99 mg/dL Final   Glucose reference range applies only to samples taken after fasting for at least 8 hours.   BUN 05/14/2021 12  8 - 23 mg/dL Final   Creatinine, Ser 05/14/2021 0.49  0.44 - 1.00 mg/dL Final   Calcium 05/14/2021 8.8 (A)  8.9 - 10.3 mg/dL Final   GFR, Estimated 05/14/2021 >60  >60 mL/min Final   Comment: (NOTE) Calculated using the CKD-EPI Creatinine Equation (2021)    Anion gap 05/14/2021 8  5 - 15 Final   Performed at [facility], [place] [name] [place], [place]  [address]   WBC 05/14/2021 9.4  4.0 - 10.5 K/uL Final   RBC 05/14/2021 2.50 (A)  3.87 - 5.11 MIL/uL Final   Hemoglobin 05/14/2021 6.9 (A)  12.0 - 15.0 g/dL Final   Comment: REPEATED TO VERIFY THIS CRITICAL RESULT HAS VERIFIED AND BEEN CALLED TO SOPHIA WRIGHT RN. BY TAMEECO CALDWELL ON 10 11 2022 AT 0351, AND HAS BEEN READ BACK.     HCT 05/14/2021 22.4 (A)  36.0 - 46.0 % Final   MCV 05/14/2021 89.6  80.0 - 100.0 fL Final   MCH 05/14/2021 27.6  26.0 - 34.0 pg Final   MCHC 05/14/2021 30.8  30.0 - 36.0 g/dL Final   RDW 05/14/2021 17.2 (A)  11.5 - 15.5 % Final   Platelets 05/14/2021 394  150 - 400 K/uL Final   nRBC 05/14/2021 0.0  0.0 - 0.2 % Final   Performed at [facility], [place] [address], [place], [place] [address]   ABO/RH(D) 05/14/2021 A POS   Final   Antibody Screen 05/14/2021 NEG   Final   Sample Expiration 05/14/2021 05/17/2021,2359   Final   Unit  Number 05/14/2021 [id number]   Final   Blood Component Type 05/14/2021 RED CELLS,LR   Final   Unit division 05/14/2021 00   Final   Status of Unit 05/14/2021 [facility]   Final   Transfusion Status 05/14/2021 OK TO TRANSFUSE   Final   Crossmatch Result 05/14/2021    Final                   Value:Compatible Performed at [facility], [place] [address], [place], [place] [address]    Order Confirmation 05/14/2021    Final                   Value:ORDER PROCESSED BY BLOOD BANK Performed at [facility], [place] [address], [place], [place] [address]    ISSUE DATE / TIME 05/14/2021 [id number]   Final   Blood Product Unit Number 05/14/2021 [id number]   Final   PRODUCT CODE 05/14/2021 [id number]   Final   Unit Type and Rh 05/14/2021 6200   Final   Blood Product Expiration Date 05/14/2021 [id number]   Final   Hemoglobin 05/14/2021 8.7 (A)  12.0 - 15.0 g/dL Final   POST TRANSFUSION SPECIMEN   HCT 05/14/2021 28.2 (A)  36.0 - 46.0 % Final   Performed at [facility], [place] [address] [place], [place] [address]   WBC 05/15/2021 8.5  4.0 - 10.5 K/uL Final   RBC 05/15/2021 2.96 (A)  3.87 - 5.11 MIL/uL Final   Hemoglobin 05/15/2021 8.3 (A)  12.0 - 15.0 g/dL Final   HCT 05/15/2021 26.6 (A)  36.0 - 46.0 % Final   MCV 05/15/2021 89.9  80.0 - 100.0 fL Final   MCH 05/15/2021 28.0  26.0 - 34.0 pg Final   MCHC 05/15/2021 31.2  30.0 - 36.0 g/dL Final   RDW 05/15/2021 16.5 (A)  11.5 - 15.5 % Final   Platelets 05/15/2021 434 (A)  150 - 400 K/uL Final   nRBC 05/15/2021 0.0  0.0 - 0.2 % Final   Performed at [facility], [place] [address], [place], [place] [address]  Orders Only on 05/08/2021  Component Date Value Ref Range Status   SARS Coronavirus 2 05/08/2021 RESULT: NEGATIVE   Final   Comment: RESULT: NEGATIVESARS-CoV-2 INTERPRETATION:A NEGATIVE  test result means that SARS-CoV-2 RNA was not present in the specimen  above the limit  of detection of this test. This does not preclude a possible SARS-CoV-2 infection and should not be used as the  sole basis for patient management decisions. Negative results must be combined with clinical observations, patient history, and epidemiological information. Optimum specimen types and timing for peak viral levels during infections caused by SARS-CoV-2  have not been determined. Collection of multiple specimens or types of specimens may be necessary to detect virus. Improper specimen collection and handling, sequence variability under primers/probes, or organism present below the limit of detection may  lead to false negative results. Positive and negative predictive values of testing are highly dependent on prevalence. False negative test results are more likely when prevalence of disease is high.The expected result is NEGATIVE.Fact S                          heet for  Healthcare Providers: LocalChronicle.no Sheet for Patients: SalonLookup.es Reference Range - Negative   Hospital Outpatient Visit on 05/02/2021  Component Date Value Ref Range Status   MRSA, PCR 05/02/2021 NEGATIVE  NEGATIVE Final   Staphylococcus aureus 05/02/2021 POSITIVE (A)  NEGATIVE Final   Comment: (NOTE) The Xpert SA Assay (FDA approved for NASAL specimens in patients 22 years of age and older), is one component of a comprehensive surveillance program. It is not intended to diagnose infection nor to guide or monitor treatment. Performed at [facility], [place] [address], [place], [place] [address]    WBC 05/02/2021 8.2  4.0 - 10.5 K/uL Final   RBC 05/02/2021 3.00 (A)  3.87 - 5.11 MIL/uL Final   Hemoglobin 05/02/2021 8.2 (A)  12.0 - 15.0 g/dL Final   HCT 05/02/2021 27.3 (A)  36.0 - 46.0 % Final   MCV 05/02/2021 91.0  80.0 - 100.0 fL Final   MCH 05/02/2021 27.3  26.0 - 34.0 pg Final   MCHC 05/02/2021 30.0  30.0 - 36.0 g/dL Final    RDW 05/02/2021 17.8 (A)  11.5 - 15.5 % Final   Platelets 05/02/2021 289  150 - 400 K/uL Final   nRBC 05/02/2021 0.0  0.0 - 0.2 % Final   Performed at [facility], [place] [address], [place], [place] [address]   Sodium 05/02/2021 140  135 - 145 mmol/L Final   Potassium 05/02/2021 2.9 (A)  3.5 - 5.1 mmol/L Final   Chloride 05/02/2021 101  98 - 111 mmol/L Final   CO2 05/02/2021 29  22 - 32 mmol/L Final   Glucose, Bld 05/02/2021 91  70 - 99 mg/dL Final   Glucose reference range applies only to samples taken after fasting for at least 8 hours.   BUN 05/02/2021 27 (A)  8 - 23 mg/dL Final   Creatinine, Ser 05/02/2021 1.05 (A)  0.44 - 1.00 mg/dL Final   Calcium 05/02/2021 8.7 (A)  8.9 - 10.3 mg/dL Final   GFR, Estimated 05/02/2021 59 (A)  >60 mL/min Final   Comment: (NOTE) Calculated using the CKD-EPI Creatinine Equation (2021)    Anion gap 05/02/2021 10  5 - 15 Final   Performed at [facility], [place] [address] [place], [place] [address]  Admission on 04/02/2021, Discharged on 04/03/2021  Component Date Value Ref Range Status   ABO/RH(D) 04/02/2021    Final                   Value:A POS Performed at [facility], [place] [address] [place] [place], [place] [address]    WBC 04/03/2021 9.3  4.0 - 10.5 K/uL Final   RBC 04/03/2021 2.95 (A)  3.87 - 5.11 MIL/uL Final   Hemoglobin 04/03/2021 7.9 (A)  12.0 - 15.0 g/dL Final   HCT 04/03/2021 26.0 (A)  36.0 - 46.0 % Final   MCV 04/03/2021 88.1  80.0 - 100.0 fL Final   MCH 04/03/2021 26.8  26.0 - 34.0 pg Final   MCHC 04/03/2021 30.4  30.0 - 36.0 g/dL Final   RDW 04/03/2021 16.3 (A)  11.5 - 15.5 % Final   Platelets 04/03/2021 264  150 - 400 K/uL Final   nRBC 04/03/2021 0.0  0.0 - 0.2 % Final   Performed at [facility], [place] [address], [place], [place] [address]   Sodium 04/03/2021 143  135 - 145 mmol/L Final   Potassium 04/03/2021 4.0  3.5 - 5.1 mmol/L Final   Chloride 04/03/2021 107   98 - 111 mmol/L Final   CO2 04/03/2021 28  22 - 32 mmol/L Final   Glucose, Bld 04/03/2021 114 (A)  70 - 99 mg/dL Final   Glucose reference range applies only to samples taken after fasting for at least 8 hours.   BUN 04/03/2021 17  8 - 23 mg/dL Final   Creatinine, Ser 04/03/2021 0.77  0.44 - 1.00 mg/dL Final   Calcium 04/03/2021 8.7 (A)  8.9 - 10.3 mg/dL Final   GFR, Estimated 04/03/2021 >60  >60 mL/min Final   Comment: (NOTE) Calculated using the CKD-EPI Creatinine Equation (2021)    Anion gap 04/03/2021 8  5 - 15 Final   Performed at [facility], [place] [address], [place], [place] [address]  Orders Only on 03/29/2021  Component Date Value Ref Range Status   SARS Coronavirus 2 03/29/2021 RESULT: NEGATIVE   Final   Comment: RESULT: NEGATIVESARS-CoV-2 INTERPRETATION:A NEGATIVE  test result means that SARS-CoV-2 RNA was not present in the specimen above the limit of detection of this test. This does not preclude a possible SARS-CoV-2 infection and should not be used as the  sole basis for patient management decisions. Negative results must be combined with clinical observations, patient history, and epidemiological information. Optimum specimen types and timing for peak viral levels during infections caused by SARS-CoV-2  have not been determined. Collection of multiple specimens or types of specimens may be necessary to detect virus. Improper specimen collection and handling, sequence variability under primers/probes, or organism present below the limit of detection may  lead to false negative results. Positive and negative predictive values of testing are highly dependent on prevalence. False negative test results are more likely when prevalence of disease is high.The expected result is NEGATIVE.Fact S                          heet for  Healthcare Providers: LocalChronicle.no Sheet for Patients: SalonLookup.es  Reference Range - Negative      X-Rays:DG Pelvis Portable  Result Date: 05/10/2021 CLINICAL DATA:  Postop EXAM: PORTABLE PELVIS 1-2 VIEWS COMPARISON:  05/10/2021, 04/21/2021 FINDINGS: Previous right hip replacement. Interval revision of left hip replacement now with fixating acetabular screw. Gas in the soft tissues consistent with recent surgery. Calcification superior to the femoral prosthetic without change. IMPRESSION: Interval revision of left hip replacement with expected postsurgical change Electronically Signed   By: Donavan Foil M.D.   On: 05/10/2021 18:06   DG C-Arm 1-60 Min-No Report  Result Date: 05/10/2021 Fluoroscopy  was utilized by the requesting physician.  No radiographic interpretation.   DG HIP OPERATIVE UNILAT W OR W/O PELVIS LEFT  Result Date: 05/10/2021 CLINICAL DATA:  Anterior left hip arthroplasty revision EXAM: OPERATIVE LEFT HIP (WITH PELVIS IF PERFORMED) 2 VIEWS TECHNIQUE: Fluoroscopic spot image(s) were submitted for interpretation post-operatively. COMPARISON:  04/21/2021 FINDINGS: Two fluoroscopic images are obtained during revision of a left hip arthroplasty. Screwed in acetabular component is now identified, with anatomic alignment with femoral component. Interval resection of the small bone fragment or heterotopic ossification seen superior to the left femoral arthroplasty on prior study. Please refer to the operative report. FLUOROSCOPY TIME:  16 seconds IMPRESSION: 1. Revision of left hip arthroplasty as above. No evidence of complication. Electronically Signed   By: Randa Ngo M.D.   On: 05/10/2021 15:42    EKG: Orders placed or performed during the hospital encounter of 03/20/21   EKG 12 lead per protocol   EKG 12 lead per protocol     Hospital Course: ATARAH CADOGAN is a 65 y.o. who was admitted to [facility]. They were brought to the operating room on 05/10/2021 and underwent Procedure(s): [place].  Patient  tolerated the procedure well and was later transferred to the recovery room and then to the orthopaedic floor for postoperative care. They were given PO and IV analgesics for pain control following their surgery. They were given 24 hours of postoperative antibiotics of  Anti-infectives (From admission, onward)    Start     Dose/Rate Route Frequency Ordered Stop   05/10/21 2000  ceFAZolin (ANCEF) IVPB 2g/100 mL premix        2 g 200 mL/hr over 30 Minutes Intravenous Every 6 hours 05/10/21 1836 05/11/21 0314   05/10/21 1130  ceFAZolin (ANCEF) IVPB 2g/100 mL premix        2 g 200 mL/hr over 30 Minutes Intravenous On call to O.R. 05/10/21 1119 05/10/21 1333      and started on DVT prophylaxis in the form of Aspirin.   PT and OT were ordered for total joint protocol. Discharge planning consulted to help with postop disposition and equipment needs. Patient had a good night on the evening of surgery. They started to get up OOB with therapy on POD #1.  Continued to work with therapy into POD #2 and #3. She has chronic anemia, but was found to have hgb 6.9 on POD #4, and received 1 unit of blood.  Pt was seen during rounds on day five and was ready to go home pending progress with therapy. Pt worked with therapy for one additional session and was meeting their goals. She was discharged to home later that day in stable condition.  Diet: Regular diet Activity: PWB Follow-up: in 2 weeks Disposition: Home Discharged Condition: good   Discharge Instructions     Call MD / Call 911   Complete by: As directed    If you experience chest pain or shortness of breath, CALL 911 and be transported to the hospital emergency room.  If you develope a fever above 101 F, pus (white drainage) or increased drainage or redness at the wound, or calf pain, call your surgeon's office.   Change dressing   Complete by: As directed    Maintain surgical dressing until follow up in the clinic. If the edges start to pull up,  may reinforce with tape. If the dressing is no longer working, may remove and cover with gauze and tape, but must  keep the area dry and clean.  Call with any questions or concerns.   Constipation Prevention   Complete by: As directed    Drink plenty of fluids.  Prune juice may be helpful.  You may use a stool softener, such as Colace (over the counter) 100 mg twice a day.  Use MiraLax (over the counter) for constipation as needed.   Diet - low sodium heart healthy   Complete by: As directed    Increase activity slowly as tolerated   Complete by: As directed    Partial weight bearing with assist device as directed.   Post-operative opioid taper instructions:   Complete by: As directed    POST-OPERATIVE OPIOID TAPER INSTRUCTIONS: It is important to wean off of your opioid medication as soon as possible. If you do not need pain medication after your surgery it is ok to stop day one. Opioids include: Codeine, Hydrocodone(Norco, Vicodin), Oxycodone(Percocet, oxycontin) and hydromorphone amongst others.  Long term and even short term use of opiods can cause: Increased pain response Dependence Constipation Depression Respiratory depression And more.  Withdrawal symptoms can include Flu like symptoms Nausea, vomiting And more Techniques to manage these symptoms Hydrate well Eat regular healthy meals Stay active Use relaxation techniques(deep breathing, meditating, yoga) Do Not substitute Alcohol to help with tapering If you have been on opioids for less than two weeks and do not have pain than it is ok to stop all together.  Plan to wean off of opioids This plan should start within one week post op of your joint replacement. Maintain the same interval or time between taking each dose and first decrease the dose.  Cut the total daily intake of opioids by one tablet each day Next start to increase the time between doses. The last dose that should be eliminated is the evening dose.       TED hose   Complete by: As directed    Use stockings (TED hose) for 2 weeks on both leg(s).  You may remove them at night for sleeping.      Allergies as of 05/15/2021       Reactions   Shellfish Allergy Itching, Swelling, Other (See Comments)   Facial swelling   Levofloxacin Other (See Comments)   Muscle Pain Pain in calves, muscle        Medication List     TAKE these medications    aspirin 81 MG chewable tablet Chew 1 tablet (81 mg total) by mouth 2 (two) times daily for 28 days.   docusate sodium 100 MG capsule Commonly known as: COLACE Take 1 capsule (100 mg total) by mouth 2 (two) times daily.   ferrous sulfate 325 (65 FE) MG tablet Take 1 tablet (325 mg total) by mouth 3 (three) times daily after meals for 14 days.   furosemide 20 MG tablet Commonly known as: LASIX Take 20 mg by mouth in the morning.   HYDROcodone-acetaminophen 7.5-325 MG tablet Commonly known as: NORCO Take 1-2 tablets by mouth every 6 (six) hours as needed for severe pain (pain score 7-10).   methocarbamol 500 MG tablet Commonly known as: ROBAXIN Take 1 tablet (500 mg total) by mouth every 6 (six) hours as needed for muscle spasms.   methylphenidate 20 MG tablet Commonly known as: RITALIN Take 20 mg by mouth 5 (five) times daily as needed (attention/focus).   multivitamin with minerals Tabs tablet Take 1 tablet by mouth in the morning.   polyethylene glycol 17 g packet Commonly  known as: MIRALAX / GLYCOLAX Take 17 g by mouth daily as needed for mild constipation.   potassium chloride 10 MEQ tablet Commonly known as: KLOR-CON Take 10 mEq by mouth in the morning.               Discharge Care Instructions  (From admission, onward)           Start     Ordered   05/14/21 0000  Change dressing       Comments: Maintain surgical dressing until follow up in the clinic. If the edges start to pull up, may reinforce with tape. If the dressing is no longer working, may remove  and cover with gauze and tape, but must keep the area dry and clean.  Call with any questions or concerns.   05/14/21 1052            Follow-up Information     Paralee Cancel, MD. Schedule an appointment as soon as possible for a visit in 2 week(s).   Specialty: Orthopedic Surgery Contact information: [address] [place] [address] [phone]                 Signed: Griffith Citron, PA-C Orthopedic Surgery 05/23/2021, 3:13 PM

## 2021-09-20 NOTE — Progress Notes (Signed)
Surgery orders requested via Epic inbox. °

## 2021-09-23 NOTE — Patient Instructions (Addendum)
DUE TO COVID-19 ONLY ONE VISITOR IS ALLOWED TO COME WITH YOU AND STAY IN THE WAITING ROOM ONLY DURING PRE OP AND PROCEDURE DAY OF SURGERY.   Up to two visitors ages 16+ are allowed at one time in a patient's room.  The visitors may rotate out with other people throughout the day.  Additionally, up to two children between the ages of 2 and 12 are allowed and do not count toward the number of allowed visitors.  Children within this age range must be accompanied by an adult visitor.  One adult visitor may remain with the patient overnight and must be in the room by 8 PM.  YOU NEED TO HAVE A COVID 19 TEST ON__2-28-23 @   _945 am ____ THIS TEST MUST BE DONE BEFORE SURGERY,     COVID TESTING [place] COME IN THROUGH MAIN ENTRANCE BE SEATED INT THE LOBBY AREA TO THE RIGHT AS YOU COME IN THE MAIN ENTRANCE DIAL [phone] GIVE THEM YOUR NAME AND LET THEM KNOW YOU ARE HERE FOR COVID TESTING    ONCE YOUR COVID TEST IS COMPLETED,  PLEASE Wear a mask when in [place]           Your procedure is scheduled on: 10-03-21   [facility] Main  Entrance   Report to admitting at       0555  AM     Call this number if you have problems the morning of surgery [phone]   Remember: NO SOLID FOOD AFTER MIDNIGHT THE NIGHT PRIOR TO SURGERY. NOTHING BY MOUTH EXCEPT CLEAR LIQUIDS UNTIL   0540am .   PLEASE FINISH ENSURE DRINK PER SURGEON ORDER  WHICH NEEDS TO BE COMPLETED AT     0540 am then nothing by mouth .     CLEAR LIQUID DIET                                                                    water Black Coffee and tea, regular and decaf  No Creamer  NO milk                          Plain Jell-O any favor except NO RED                                Fruit ices (not with fruit pulp)                                      Iced Popsicles  NO RED                                                                      white grape and apple  juices Sports drinks like Gatorade  NO RED Lightly seasoned clear  broth or consume(fat free) Sugar, honey syrup  _____________________________________________________________________    BRUSH YOUR TEETH MORNING OF SURGERY AND RINSE YOUR MOUTH OUT, NO CHEWING GUM CANDY OR MINTS.     Take these medicines the morning of surgery with A SIP OF WATER: Zegrid, tramadol if needed                                 You may not have any metal on your body including hair pins and              piercings  Do not wear jewelry, make-up, lotions, powders,perfumes,  or      deodorant             Do not wear nail polish on your fingernails or toenails .  Do not shave  48 hours prior to surgery.               Do not bring valuables to the hospital. [place].  Contacts, dentures or bridgework may not be worn into surgery.  You may bring a small overnight bag with you     Patients discharged the day of surgery will not be allowed to drive home. IF YOU ARE HAVING SURGERY AND GOING HOME THE SAME DAY, YOU MUST HAVE AN ADULT TO DRIVE YOU HOME AND BE WITH YOU FOR 24 HOURS. YOU MAY GO HOME BY TAXI OR UBER OR ORTHERWISE, BUT AN ADULT MUST ACCOMPANY YOU HOME AND STAY WITH YOU FOR 24 HOURS.  Name and phone number of your driver:  Special Instructions: N/A              Please read over the following fact sheets you were given: _____________________________________________________________________             [facility] - Preparing for Surgery Before surgery, you can play an important role.  Because skin is not sterile, your skin needs to be as free of germs as possible.  You can reduce the number of germs on your skin by washing with CHG (chlorahexidine gluconate) soap before surgery.  CHG is an antiseptic cleaner which kills germs and bonds with the skin to continue killing germs even after washing. Please DO NOT use if you have an allergy to CHG or antibacterial  soaps.  If your skin becomes reddened/irritated stop using the CHG and inform your nurse when you arrive at Short Stay. Do not shave (including legs and underarms) for at least 48 hours prior to the first CHG shower.  You may shave your face/neck. Please follow these instructions carefully:  1.  Shower with CHG Soap the night before surgery and the  morning of Surgery.  2.  If you choose to wash your hair, wash your hair first as usual with your  normal  shampoo.  3.  After you shampoo, rinse your hair and body thoroughly to remove the  shampoo.                           4.  Use CHG as you would any other liquid soap.  You can apply chg directly  to the skin and wash                       Gently  with a scrungie or clean washcloth.  5.  Apply the CHG Soap to your body ONLY FROM THE NECK DOWN.   Do not use on face/ open                           Wound or open sores. Avoid contact with eyes, ears mouth and genitals (private parts).                       Wash face,  Genitals (private parts) with your normal soap.             6.  Wash thoroughly, paying special attention to the area where your surgery  will be performed.  7.  Thoroughly rinse your body with warm water from the neck down.  8.  DO NOT shower/wash with your normal soap after using and rinsing off  the CHG Soap.                9.  Pat yourself dry with a clean towel.            10.  Wear clean pajamas.            11.  Place clean sheets on your bed the night of your first shower and do not  sleep with pets. Day of Surgery : Do not apply any lotions/deodorants the morning of surgery.  Please wear clean clothes to the hospital/surgery center.  FAILURE TO FOLLOW THESE INSTRUCTIONS MAY RESULT IN THE CANCELLATION OF YOUR SURGERY PATIENT SIGNATURE_________________________________  NURSE SIGNATURE__________________________________  ________________________________________________________________________    Miranda Graham  An  incentive spirometer is a tool that can help keep your lungs clear and active. This tool measures how well you are filling your lungs with each breath. Taking long deep breaths may help reverse or decrease the chance of developing breathing (pulmonary) problems (especially infection) following: A long period of time when you are unable to move or be active. BEFORE THE PROCEDURE  If the spirometer includes an indicator to show your best effort, your nurse or respiratory therapist will set it to a desired goal. If possible, sit up straight or lean slightly forward. Try not to slouch. Hold the incentive spirometer in an upright position. INSTRUCTIONS FOR USE  Sit on the edge of your bed if possible, or sit up as far as you can in bed or on a chair. Hold the incentive spirometer in an upright position. Breathe out normally. Place the mouthpiece in your mouth and seal your lips tightly around it. Breathe in slowly and as deeply as possible, raising the piston or the ball toward the top of the column. Hold your breath for 3-5 seconds or for as long as possible. Allow the piston or ball to fall to the bottom of the column. Remove the mouthpiece from your mouth and breathe out normally. Rest for a few seconds and repeat Steps 1 through 7 at least 10 times every 1-2 hours when you are awake. Take your time and take a few normal breaths between deep breaths. The spirometer may include an indicator to show your best effort. Use the indicator as a goal to work toward during each repetition. After each set of 10 deep breaths, practice coughing to be sure your lungs are clear. If you have an incision (the cut made at the time of surgery), support your incision when coughing by placing a pillow or rolled up towels firmly  against it. Once you are able to get out of bed, walk around indoors and cough well. You may stop using the incentive spirometer when instructed by your caregiver.  RISKS AND COMPLICATIONS Take  your time so you do not get dizzy or light-headed. If you are in pain, you may need to take or ask for pain medication before doing incentive spirometry. It is harder to take a deep breath if you are having pain. AFTER USE Rest and breathe slowly and easily. It can be helpful to keep track of a log of your progress. Your caregiver can provide you with a simple table to help with this. If you are using the spirometer at home, follow these instructions: Miranda Graham IF:  You are having difficultly using the spirometer. You have trouble using the spirometer as often as instructed. Your pain medication is not giving enough relief while using the spirometer. You develop fever of 100.5 F (38.1 C) or higher. SEEK IMMEDIATE MEDICAL CARE IF:  You cough up bloody sputum that had not been present before. You develop fever of 102 F (38.9 C) or greater. You develop worsening pain at or near the incision site. MAKE SURE YOU:  Understand these instructions. Will watch your condition. Will get help right away if you are not doing well or get worse. Document Released: 12/01/2006 Document Revised: 10/13/2011 Document Reviewed: 02/01/2007 [facility] Patient Information 2014 [place], [place].   ________________________________________________________________________

## 2021-09-23 NOTE — Progress Notes (Addendum)
PCP - Noel Journey ,MD [facility] internal medicine Cardiologist - no  PPM/ICD -  Device Orders -  Rep Notified -   Chest x-ray -  EKG - 03-20-21 Stress Test -  ECHO -  Cardiac Cath -   Sleep Study -  CPAP -   Fasting Blood Sugar -  Checks Blood Sugar _____ times a day  Blood Thinner Instructions: Aspirin Instructions:  ERAS Protcol - PRE-SURGERY Ensure   COVID TEST- 10-01-21 COVID vaccine -fully vaccinated and boosted Moderna  Activity--Able to do ADL's with no SOB Anesthesia review:   Patient denies shortness of breath, fever, cough and chest pain at PAT appointment   All instructions explained to the patient, with a verbal understanding of the material. Patient agrees to go over the instructions while at home for a better understanding. Patient also instructed to self quarantine after being tested for COVID-19. The opportunity to ask questions was provided.

## 2021-09-25 ENCOUNTER — Encounter (HOSPITAL_COMMUNITY): Payer: Self-pay

## 2021-09-25 ENCOUNTER — Encounter (HOSPITAL_COMMUNITY)
Admission: RE | Admit: 2021-09-25 | Discharge: 2021-09-25 | Disposition: A | Discharge status: Home / Self Care | Payer: Medicare Other | Source: Ambulatory Visit | Attending: Orthopedic Surgery | Admitting: Orthopedic Surgery

## 2021-09-25 ENCOUNTER — Other Ambulatory Visit: Payer: Self-pay

## 2021-09-25 DIAGNOSIS — Z01818 Encounter for other preprocedural examination: Secondary | ICD-10-CM

## 2021-09-25 HISTORY — DX: Pneumonia, unspecified organism: J18.9

## 2021-09-25 NOTE — Progress Notes (Signed)
Pt. Missed preop. Done over phone will be for labs and COVID tes 10-01-21 @ 945. Chart will be left with Florentina Jenny

## 2021-09-30 NOTE — H&P (Signed)
TOTAL HIP REVISION ADMISSION H&P  Patient is admitted for left revision total hip arthroplasty.  Subjective:  Chief Complaint: left hip pain  HPI: Miranda Graham, 66 y.o. female, has a history of primary anterior hip arthroplasty in 2021 by a physician at another location, with 5 subsequent dislocations within the first year. She underwent total hip revision by Dr. Alvan Dame on 05/10/21, at which point she was converted to a dual mobility hip. She did not have further issues with instability, but presented with pain which seemed to be related to lack of bony ingrowth on the acetabulum. Dr. Alvan Dame discussed need for revision to a gription cup, which she agreed with. There is no current active infection.  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   Past Medical History:  Diagnosis Date   Anemia    Arthritis    GERD (gastroesophageal reflux disease)    History of kidney stones    Ovarian cancer ([place])    Pneumonia     Past Surgical History:  Procedure Laterality Date   ABDOMINAL HYSTERECTOMY     FINGER SURGERY Right    HIP ARTHROPLASTY Left    TOTAL HIP ARTHROPLASTY Right 04/02/2021   Procedure: TOTAL HIP ARTHROPLASTY ANTERIOR APPROACH;  Surgeon: Paralee Cancel, MD;  Location: [facility];  Service: Orthopedics;  Laterality: Right;   TOTAL HIP REVISION Left 05/10/2021   Procedure: TOTAL HIP REVISION-ANTERIOR APPROACH;  Surgeon: Paralee Cancel, MD;  Location: [facility];  Service: Orthopedics;  Laterality: Left;  2 HRS    No current facility-administered medications for this encounter.   Current Outpatient Medications  Medication Sig Dispense Refill Last Dose   ferrous sulfate 325 (65 FE) MG tablet Take 1 tablet (325 mg total) by mouth 3 (three) times daily after meals for 14 days. 42 tablet 0    ibuprofen (ADVIL) 200 MG tablet Take 400 mg by  mouth every 6 (six) hours as needed for moderate pain.      methylphenidate (RITALIN) 20 MG tablet Take 20 mg by mouth 4 (four) times daily.      Multiple Vitamin (MULTIVITAMIN WITH MINERALS) TABS tablet Take 1 tablet by mouth in the morning.      Omeprazole-Sodium Bicarbonate (ZEGERID) 20-1100 MG CAPS capsule Take 1 capsule by mouth daily.      traMADol (ULTRAM) 50 MG tablet Take 50 mg by mouth 2 (two) times daily as needed for pain.      Allergies  Allergen Reactions   Shellfish Allergy Itching, Swelling and Other (See Comments)    Facial swelling   Levofloxacin Other (See Comments)    Muscle Pain Pain in calves, muscle    Social History   Tobacco Use   Smoking status: Former    Packs/day: 1.00    Years: 15.00    Pack years: 15.00    Types: Cigarettes   Smokeless tobacco: Never  Substance Use Topics   Alcohol use: Never    No family history on file.    Review of Systems  Constitutional:  Negative for chills and fever.  Respiratory:  Negative for cough and shortness of breath.   Cardiovascular:  Negative for chest pain.  Gastrointestinal:  Negative for nausea and vomiting.  Musculoskeletal:  Positive for arthralgias.    Objective:  Physical Exam  Very pleasant 66 year old female awake alert  and oriented. She is in no acute distress. She does walk in the office today with a cane but does have a persistent limp related to weakness but also pain in the left hip.  Left hip exam: Mild tenderness around the left hip No signs of infection with regards to erythema or warmth Some pain reproduced anteriorly with hip flexion internal and external rotation Neurovascular intact distally  Vital signs in last 24 hours:     Labs:   Estimated body mass index is 40.52 kg/m as calculated from the following:   Height as of 05/10/21: 5\' 2"  (1.575 m).   Weight as of 05/10/21: 100.5 kg.  Imaging Review:  Imaging: AP pelvis and lateral of the left hip were ordered and reviewed  as well as compared to prior films. I am worried that there is failure of bony ingrowth of her acetabular component. This is resulting in persistent component related instability resulting in pain. Femoral component remain stable.    Assessment/Plan:  Failed left hip, left hip(s) with failed previous arthroplasty.  The patient history, physical examination, clinical judgement of the provider and imaging studies are consistent with end stage degenerative joint disease of the left hip(s), previous total hip arthroplasty. Revision total hip arthroplasty is deemed medically necessary. The treatment options including medical management, injection therapy, arthroscopy and arthroplasty were discussed at length. The risks and benefits of total hip arthroplasty were presented and reviewed. The risks due to aseptic loosening, infection, stiffness, dislocation/subluxation,  thromboembolic complications and other imponderables were discussed.  The patient acknowledged the explanation, agreed to proceed with the plan and consent was signed. Patient is being admitted for inpatient treatment for surgery, pain control, PT, OT, prophylactic antibiotics, VTE prophylaxis, progressive ambulation and ADL's and discharge planning. The patient is planning to be discharged  home.   Therapy Plans: HEP Disposition: Home with sister and family Planned DVT Prophylaxis: aspirin 81mg  BID DME needed: none PCP: Dr. Fransisco Beau Cardiologist: Dr. Clayborn Bigness, clearance received ( saw 1 time in January for LE edema - no hx of MI, arrhythmias, stroke, stents) TXA: IV Allergies: levaquin - calf pain, shellfish - angioedema Anesthesia Concerns: none BMI: 34.4 Last HgbA1c: Not diabetic   - Hx of ovarian CA 30 years ago  - Hydrocodone 7.5, robaxin  Costella Hatcher, PA-C Orthopedic Surgery [facility] [place] [phone]

## 2021-10-01 ENCOUNTER — Encounter (HOSPITAL_COMMUNITY)
Admission: RE | Admit: 2021-10-01 | Discharge: 2021-10-01 | Disposition: A | Discharge status: Home / Self Care | Payer: Medicare Other | Source: Ambulatory Visit | Attending: Orthopedic Surgery | Admitting: Orthopedic Surgery

## 2021-10-01 ENCOUNTER — Other Ambulatory Visit: Payer: Self-pay

## 2021-10-01 DIAGNOSIS — Z01812 Encounter for preprocedural laboratory examination: Secondary | ICD-10-CM | POA: Insufficient documentation

## 2021-10-01 DIAGNOSIS — Z20822 Contact with and (suspected) exposure to covid-19: Secondary | ICD-10-CM | POA: Insufficient documentation

## 2021-10-01 DIAGNOSIS — Z01818 Encounter for other preprocedural examination: Secondary | ICD-10-CM

## 2021-10-01 DIAGNOSIS — Z96642 Presence of left artificial hip joint: Secondary | ICD-10-CM

## 2021-10-01 LAB — COMPREHENSIVE METABOLIC PANEL WITH GFR
ALT: 14 U/L (ref 0–44)
AST: 19 U/L (ref 15–41)
Albumin: 3.9 g/dL (ref 3.5–5.0)
Alkaline Phosphatase: 85 U/L (ref 38–126)
Anion gap: 7 (ref 5–15)
BUN: 16 mg/dL (ref 8–23)
CO2: 28 mmol/L (ref 22–32)
Calcium: 9.9 mg/dL (ref 8.9–10.3)
Chloride: 105 mmol/L (ref 98–111)
Creatinine, Ser: 0.83 mg/dL (ref 0.44–1.00)
GFR, Estimated: >60 mL/min
Glucose, Bld: 100 mg/dL — ABNORMAL HIGH (ref 70–99)
Potassium: 3.7 mmol/L (ref 3.5–5.1)
Sodium: 140 mmol/L (ref 135–145)
Total Bilirubin: 0.2 mg/dL — ABNORMAL LOW (ref 0.3–1.2)
Total Protein: 7.3 g/dL (ref 6.5–8.1)

## 2021-10-01 LAB — CBC
HCT: 37.7 % (ref 36.0–46.0)
Hemoglobin: 11.6 g/dL — ABNORMAL LOW (ref 12.0–15.0)
MCH: 27.4 pg (ref 26.0–34.0)
MCHC: 30.8 g/dL (ref 30.0–36.0)
MCV: 89.1 fL (ref 80.0–100.0)
Platelets: 355 10*3/uL (ref 150–400)
RBC: 4.23 MIL/uL (ref 3.87–5.11)
RDW: 16.9 % — ABNORMAL HIGH (ref 11.5–15.5)
WBC: 8.3 10*3/uL (ref 4.0–10.5)
nRBC: 0 % (ref 0.0–0.2)

## 2021-10-01 LAB — SURGICAL PCR SCREEN
MRSA, PCR: NEGATIVE
Staphylococcus aureus: POSITIVE — AB

## 2021-10-01 LAB — SARS CORONAVIRUS 2 (TAT 6-24 HRS): SARS Coronavirus 2: NEGATIVE

## 2021-10-02 NOTE — Anesthesia Preprocedure Evaluation (Addendum)
Anesthesia Evaluation  ?Patient identified by MRN, date of birth, ID band ?Patient awake ? ? ? ?Reviewed: ?Allergy & Precautions, NPO status , Patient's Chart, lab work & pertinent test results ? ?History of Anesthesia Complications ?Negative for: history of anesthetic complications ? ?Airway ?Mallampati: II ? ?TM Distance: >3 FB ?Neck ROM: Full ? ? ? Dental ?no notable dental hx. ? ?  ?Pulmonary ?former smoker,  ?  ?Pulmonary exam normal ? ? ? ? ? ? ? Cardiovascular ?negative cardio ROS ?Normal cardiovascular exam ? ? ?  ?Neuro/Psych ?negative neurological ROS ? negative psych ROS  ? GI/Hepatic ?Neg liver ROS, GERD  Medicated,  ?Endo/Other  ?negative endocrine ROS ? Renal/GU ?negative Renal ROS  ?negative genitourinary ?  ?Musculoskeletal ? ?(+) Arthritis ,  ? Abdominal ?  ?Peds ? Hematology ? ?(+) Blood dyscrasia, anemia , Hgb 11.6   ?Anesthesia Other Findings ?Day of surgery medications reviewed with patient. ? Reproductive/Obstetrics ?negative OB ROS ? ?  ? ? ? ? ? ? ? ? ? ? ? ? ? ?  ?  ? ? ? ? ? ? ? ?Anesthesia Physical ?Anesthesia Plan ? ?ASA: 2 ? ?Anesthesia Plan: Spinal  ? ?Post-op Pain Management: Tylenol PO (pre-op)  ? ?Induction:  ? ?PONV Risk Score and Plan: 3 and Treatment may vary due to age or medical condition, Ondansetron, Propofol infusion, Dexamethasone and Midazolam ? ?Airway Management Planned: Natural Airway and Simple Face Mask ? ?Additional Equipment: None ? ?Intra-op Plan:  ? ?Post-operative Plan:  ? ?Informed Consent: I have reviewed the patients History and Physical, chart, labs and discussed the procedure including the risks, benefits and alternatives for the proposed anesthesia with the patient or authorized representative who has indicated his/her understanding and acceptance.  ? ? ? ? ? ?Plan Discussed with: CRNA ? ?Anesthesia Plan Comments:   ? ? ? ? ? ?Anesthesia Quick Evaluation ? ?

## 2021-10-03 ENCOUNTER — Inpatient Hospital Stay (HOSPITAL_COMMUNITY): Payer: Medicare Other | Admitting: Anesthesiology

## 2021-10-03 ENCOUNTER — Encounter (HOSPITAL_COMMUNITY): Payer: Self-pay | Admitting: Orthopedic Surgery

## 2021-10-03 ENCOUNTER — Inpatient Hospital Stay (HOSPITAL_COMMUNITY): Payer: Medicare Other

## 2021-10-03 ENCOUNTER — Inpatient Hospital Stay (HOSPITAL_COMMUNITY)
Admission: RE | Admit: 2021-10-03 | Discharge: 2021-10-04 | DRG: 467 | Disposition: A | Discharge status: Home / Self Care | Payer: Medicare Other | Source: Ambulatory Visit | Attending: Orthopedic Surgery | Admitting: Orthopedic Surgery

## 2021-10-03 ENCOUNTER — Other Ambulatory Visit: Payer: Self-pay

## 2021-10-03 ENCOUNTER — Encounter (HOSPITAL_COMMUNITY)
Admission: RE | Disposition: A | Discharge status: Home / Self Care | Payer: Self-pay | Source: Ambulatory Visit | Attending: Orthopedic Surgery

## 2021-10-03 ENCOUNTER — Inpatient Hospital Stay (HOSPITAL_COMMUNITY): Payer: Medicare Other | Admitting: Physician Assistant

## 2021-10-03 DIAGNOSIS — Z91013 Allergy to seafood: Secondary | ICD-10-CM | POA: Diagnosis not present

## 2021-10-03 DIAGNOSIS — Z87891 Personal history of nicotine dependence: Secondary | ICD-10-CM

## 2021-10-03 DIAGNOSIS — Z20822 Contact with and (suspected) exposure to covid-19: Secondary | ICD-10-CM | POA: Diagnosis present

## 2021-10-03 DIAGNOSIS — Z96641 Presence of right artificial hip joint: Secondary | ICD-10-CM | POA: Diagnosis present

## 2021-10-03 DIAGNOSIS — Z79899 Other long term (current) drug therapy: Secondary | ICD-10-CM | POA: Diagnosis not present

## 2021-10-03 DIAGNOSIS — Z888 Allergy status to other drugs, medicaments and biological substances status: Secondary | ICD-10-CM

## 2021-10-03 DIAGNOSIS — Z96649 Presence of unspecified artificial hip joint: Secondary | ICD-10-CM

## 2021-10-03 DIAGNOSIS — D62 Acute posthemorrhagic anemia: Secondary | ICD-10-CM | POA: Diagnosis not present

## 2021-10-03 DIAGNOSIS — Y792 Prosthetic and other implants, materials and accessory orthopedic devices associated with adverse incidents: Secondary | ICD-10-CM | POA: Diagnosis present

## 2021-10-03 DIAGNOSIS — Z8543 Personal history of malignant neoplasm of ovary: Secondary | ICD-10-CM | POA: Diagnosis not present

## 2021-10-03 DIAGNOSIS — T84091A Other mechanical complication of internal left hip prosthesis, initial encounter: Secondary | ICD-10-CM

## 2021-10-03 DIAGNOSIS — M1612 Unilateral primary osteoarthritis, left hip: Secondary | ICD-10-CM | POA: Diagnosis present

## 2021-10-03 DIAGNOSIS — Z96642 Presence of left artificial hip joint: Principal | ICD-10-CM | POA: Diagnosis present

## 2021-10-03 DIAGNOSIS — K219 Gastro-esophageal reflux disease without esophagitis: Secondary | ICD-10-CM | POA: Diagnosis present

## 2021-10-03 HISTORY — PX: TOTAL HIP REVISION: SHX763

## 2021-10-03 LAB — TYPE AND SCREEN
ABO/RH(D): A POS
Antibody Screen: NEGATIVE

## 2021-10-03 SURGERY — TOTAL HIP REVISION
Anesthesia: Spinal | Site: Hip | Laterality: Left

## 2021-10-03 MED ORDER — SODIUM CHLORIDE 0.9 % IR SOLN
Status: DC | PRN
  Administered 2021-10-03: 1000 mL
  Administered 2021-10-03: 3000 mL

## 2021-10-03 MED ORDER — FENTANYL CITRATE (PF) 100 MCG/2ML IJ SOLN
INTRAMUSCULAR | Status: DC | PRN
  Administered 2021-10-03: 25 ug via INTRAVENOUS
  Administered 2021-10-03: 50 ug via INTRAVENOUS
  Administered 2021-10-03 (×2): 25 ug via INTRAVENOUS

## 2021-10-03 MED ORDER — ASPIRIN 81 MG PO CHEW
81.0000 mg | CHEWABLE_TABLET | Freq: Two times a day (BID) | ORAL | Status: DC
  Administered 2021-10-03 – 2021-10-04 (×2): 81 mg via ORAL
  Filled 2021-10-03 (×2): qty 1

## 2021-10-03 MED ORDER — DEXMEDETOMIDINE (PRECEDEX) IN NS 20 MCG/5ML (4 MCG/ML) IV SYRINGE
PREFILLED_SYRINGE | INTRAVENOUS | Status: AC
  Filled 2021-10-03: qty 5

## 2021-10-03 MED ORDER — DIPHENHYDRAMINE HCL 12.5 MG/5ML PO ELIX: 12.5000 mg | ORAL_SOLUTION | ORAL | Status: DC | PRN

## 2021-10-03 MED ORDER — BUPIVACAINE HCL (PF) 0.5 % IJ SOLN
INTRAMUSCULAR | Status: DC | PRN
  Administered 2021-10-03: 3 mL via INTRATHECAL

## 2021-10-03 MED ORDER — CEFAZOLIN SODIUM-DEXTROSE 2-4 GM/100ML-% IV SOLN
2.0000 g | Freq: Four times a day (QID) | INTRAVENOUS | Status: AC
  Administered 2021-10-03 (×2): 2 g via INTRAVENOUS
  Filled 2021-10-03 (×2): qty 100

## 2021-10-03 MED ORDER — METHOCARBAMOL 500 MG PO TABS
500.0000 mg | ORAL_TABLET | Freq: Four times a day (QID) | ORAL | Status: DC | PRN
  Administered 2021-10-03 – 2021-10-04 (×3): 500 mg via ORAL
  Filled 2021-10-03 (×3): qty 1

## 2021-10-03 MED ORDER — CHLORHEXIDINE GLUCONATE CLOTH 2 % EX PADS: 6.0000 | MEDICATED_PAD | Freq: Every day | CUTANEOUS | Status: DC

## 2021-10-03 MED ORDER — PROPOFOL 500 MG/50ML IV EMUL
INTRAVENOUS | Status: DC | PRN
  Administered 2021-10-03: 50 ug/kg/min via INTRAVENOUS

## 2021-10-03 MED ORDER — EPHEDRINE 5 MG/ML INJ
INTRAVENOUS | Status: AC
  Filled 2021-10-03: qty 5

## 2021-10-03 MED ORDER — DEXAMETHASONE SODIUM PHOSPHATE 10 MG/ML IJ SOLN
8.0000 mg | Freq: Once | INTRAMUSCULAR | Status: AC
  Administered 2021-10-03: 8 mg via INTRAVENOUS

## 2021-10-03 MED ORDER — ACETAMINOPHEN 325 MG PO TABS: 325.0000 mg | ORAL_TABLET | Freq: Four times a day (QID) | ORAL | Status: DC | PRN

## 2021-10-03 MED ORDER — LIDOCAINE HCL (PF) 2 % IJ SOLN
INTRAMUSCULAR | Status: AC
  Filled 2021-10-03: qty 5

## 2021-10-03 MED ORDER — LACTATED RINGERS IV SOLN: INTRAVENOUS | Status: DC

## 2021-10-03 MED ORDER — ONDANSETRON HCL 4 MG/2ML IJ SOLN: 4.0000 mg | Freq: Four times a day (QID) | INTRAMUSCULAR | Status: DC | PRN

## 2021-10-03 MED ORDER — FENTANYL CITRATE PF 50 MCG/ML IJ SOSY: 25.0000 ug | PREFILLED_SYRINGE | INTRAMUSCULAR | Status: DC | PRN

## 2021-10-03 MED ORDER — FERROUS SULFATE 325 (65 FE) MG PO TABS
325.0000 mg | ORAL_TABLET | Freq: Three times a day (TID) | ORAL | Status: DC
  Administered 2021-10-03 – 2021-10-04 (×2): 325 mg via ORAL
  Filled 2021-10-03 (×2): qty 1

## 2021-10-03 MED ORDER — MIDAZOLAM HCL 2 MG/2ML IJ SOLN
INTRAMUSCULAR | Status: AC
  Filled 2021-10-03: qty 2

## 2021-10-03 MED ORDER — MENTHOL 3 MG MT LOZG: 1.0000 | LOZENGE | OROMUCOSAL | Status: DC | PRN

## 2021-10-03 MED ORDER — ORAL CARE MOUTH RINSE: 15.0000 mL | Freq: Once | OROMUCOSAL | Status: AC

## 2021-10-03 MED ORDER — FENTANYL CITRATE (PF) 100 MCG/2ML IJ SOLN
INTRAMUSCULAR | Status: AC
  Filled 2021-10-03: qty 2

## 2021-10-03 MED ORDER — EPHEDRINE SULFATE (PRESSORS) 50 MG/ML IJ SOLN
INTRAMUSCULAR | Status: DC | PRN
  Administered 2021-10-03: 5 mg via INTRAVENOUS

## 2021-10-03 MED ORDER — OXYCODONE HCL 5 MG PO TABS: 5.0000 mg | ORAL_TABLET | Freq: Once | ORAL | Status: DC | PRN

## 2021-10-03 MED ORDER — CHLORHEXIDINE GLUCONATE 0.12 % MT SOLN
15.0000 mL | Freq: Once | OROMUCOSAL | Status: AC
  Administered 2021-10-03: 15 mL via OROMUCOSAL

## 2021-10-03 MED ORDER — TRANEXAMIC ACID-NACL 1000-0.7 MG/100ML-% IV SOLN
1000.0000 mg | INTRAVENOUS | Status: AC
  Administered 2021-10-03: 1000 mg via INTRAVENOUS
  Filled 2021-10-03: qty 100

## 2021-10-03 MED ORDER — MORPHINE SULFATE (PF) 2 MG/ML IV SOLN
0.5000 mg | INTRAVENOUS | Status: DC | PRN
  Administered 2021-10-03 (×2): 1 mg via INTRAVENOUS
  Filled 2021-10-03 (×2): qty 1

## 2021-10-03 MED ORDER — HYDROCODONE-ACETAMINOPHEN 5-325 MG PO TABS
ORAL_TABLET | ORAL | Status: AC
  Administered 2021-10-03: 1 via ORAL
  Filled 2021-10-03: qty 1

## 2021-10-03 MED ORDER — BISACODYL 10 MG RE SUPP: 10.0000 mg | Freq: Every day | RECTAL | Status: DC | PRN

## 2021-10-03 MED ORDER — DEXAMETHASONE SODIUM PHOSPHATE 10 MG/ML IJ SOLN
INTRAMUSCULAR | Status: AC
  Filled 2021-10-03: qty 1

## 2021-10-03 MED ORDER — ONDANSETRON HCL 4 MG/2ML IJ SOLN
INTRAMUSCULAR | Status: DC | PRN
  Administered 2021-10-03: 4 mg via INTRAVENOUS

## 2021-10-03 MED ORDER — DEXAMETHASONE SODIUM PHOSPHATE 10 MG/ML IJ SOLN
10.0000 mg | Freq: Once | INTRAMUSCULAR | Status: AC
  Administered 2021-10-04: 10 mg via INTRAVENOUS
  Filled 2021-10-03: qty 1

## 2021-10-03 MED ORDER — ONDANSETRON HCL 4 MG PO TABS
4.0000 mg | ORAL_TABLET | Freq: Four times a day (QID) | ORAL | Status: DC | PRN
  Filled 2021-10-03: qty 1

## 2021-10-03 MED ORDER — HYDROCODONE-ACETAMINOPHEN 5-325 MG PO TABS: 1.0000 | ORAL_TABLET | ORAL | Status: DC | PRN

## 2021-10-03 MED ORDER — ACETAMINOPHEN 500 MG PO TABS
1000.0000 mg | ORAL_TABLET | Freq: Once | ORAL | Status: AC
  Administered 2021-10-03: 1000 mg via ORAL
  Filled 2021-10-03: qty 2

## 2021-10-03 MED ORDER — HYDROCODONE-ACETAMINOPHEN 7.5-325 MG PO TABS
1.0000 | ORAL_TABLET | ORAL | Status: DC | PRN
  Administered 2021-10-03: 1 via ORAL
  Administered 2021-10-03 – 2021-10-04 (×5): 2 via ORAL
  Filled 2021-10-03: qty 1
  Filled 2021-10-03 (×5): qty 2

## 2021-10-03 MED ORDER — PROPOFOL 10 MG/ML IV BOLUS
INTRAVENOUS | Status: DC | PRN
  Administered 2021-10-03 (×2): 10 mg via INTRAVENOUS

## 2021-10-03 MED ORDER — MIDAZOLAM HCL 5 MG/5ML IJ SOLN
INTRAMUSCULAR | Status: DC | PRN
  Administered 2021-10-03 (×2): 1 mg via INTRAVENOUS

## 2021-10-03 MED ORDER — METOCLOPRAMIDE HCL 5 MG PO TABS
5.0000 mg | ORAL_TABLET | Freq: Three times a day (TID) | ORAL | Status: DC | PRN
  Filled 2021-10-03: qty 2

## 2021-10-03 MED ORDER — POVIDONE-IODINE 10 % EX SWAB
2.0000 "application " | Freq: Once | CUTANEOUS | Status: AC
  Administered 2021-10-03: 2 via TOPICAL

## 2021-10-03 MED ORDER — PHENYLEPHRINE HCL-NACL 20-0.9 MG/250ML-% IV SOLN
INTRAVENOUS | Status: DC | PRN
  Administered 2021-10-03: 30 ug/min via INTRAVENOUS

## 2021-10-03 MED ORDER — METOCLOPRAMIDE HCL 5 MG/ML IJ SOLN: 5.0000 mg | Freq: Three times a day (TID) | INTRAMUSCULAR | Status: DC | PRN

## 2021-10-03 MED ORDER — SODIUM CHLORIDE 0.9 % IV SOLN: INTRAVENOUS | Status: DC

## 2021-10-03 MED ORDER — CEFAZOLIN SODIUM-DEXTROSE 2-4 GM/100ML-% IV SOLN
2.0000 g | INTRAVENOUS | Status: AC
  Administered 2021-10-03: 2 g via INTRAVENOUS
  Filled 2021-10-03: qty 100

## 2021-10-03 MED ORDER — POLYETHYLENE GLYCOL 3350 17 G PO PACK: 17.0000 g | PACK | Freq: Every day | ORAL | Status: DC | PRN

## 2021-10-03 MED ORDER — METHOCARBAMOL 500 MG IVPB - SIMPLE MED
500.0000 mg | Freq: Four times a day (QID) | INTRAVENOUS | Status: DC | PRN
  Filled 2021-10-03: qty 50

## 2021-10-03 MED ORDER — LIDOCAINE HCL (CARDIAC) PF 100 MG/5ML IV SOSY
PREFILLED_SYRINGE | INTRAVENOUS | Status: DC | PRN
  Administered 2021-10-03: 40 mg via INTRAVENOUS
  Administered 2021-10-03: 20 mg via INTRAVENOUS

## 2021-10-03 MED ORDER — OXYCODONE HCL 5 MG/5ML PO SOLN: 5.0000 mg | Freq: Once | ORAL | Status: DC | PRN

## 2021-10-03 MED ORDER — DEXMEDETOMIDINE (PRECEDEX) IN NS 20 MCG/5ML (4 MCG/ML) IV SYRINGE
PREFILLED_SYRINGE | INTRAVENOUS | Status: DC | PRN
  Administered 2021-10-03: 8 ug via INTRAVENOUS
  Administered 2021-10-03 (×2): 4 ug via INTRAVENOUS

## 2021-10-03 MED ORDER — PHENYLEPHRINE HCL-NACL 20-0.9 MG/250ML-% IV SOLN
INTRAVENOUS | Status: AC
  Filled 2021-10-03: qty 250

## 2021-10-03 MED ORDER — TRANEXAMIC ACID-NACL 1000-0.7 MG/100ML-% IV SOLN
1000.0000 mg | Freq: Once | INTRAVENOUS | Status: AC
  Administered 2021-10-03: 1000 mg via INTRAVENOUS
  Filled 2021-10-03: qty 100

## 2021-10-03 MED ORDER — PHENOL 1.4 % MT LIQD: 1.0000 | OROMUCOSAL | Status: DC | PRN

## 2021-10-03 MED ORDER — DOCUSATE SODIUM 100 MG PO CAPS
100.0000 mg | ORAL_CAPSULE | Freq: Two times a day (BID) | ORAL | Status: DC
  Administered 2021-10-03 – 2021-10-04 (×2): 100 mg via ORAL
  Filled 2021-10-03 (×2): qty 1

## 2021-10-03 MED ORDER — ONDANSETRON HCL 4 MG/2ML IJ SOLN
INTRAMUSCULAR | Status: AC
  Filled 2021-10-03: qty 2

## 2021-10-03 MED ORDER — PROPOFOL 1000 MG/100ML IV EMUL
INTRAVENOUS | Status: AC
  Filled 2021-10-03: qty 100

## 2021-10-03 MED ORDER — PANTOPRAZOLE SODIUM 40 MG PO TBEC
40.0000 mg | DELAYED_RELEASE_TABLET | Freq: Every day | ORAL | Status: DC
  Administered 2021-10-03 – 2021-10-04 (×2): 40 mg via ORAL
  Filled 2021-10-03 (×2): qty 1

## 2021-10-03 SURGICAL SUPPLY — 54 items
ADH SKN CLS APL DERMABOND .7 (GAUZE/BANDAGES/DRESSINGS) ×1
BAG COUNTER SPONGE SURGICOUNT (BAG) ×1 IMPLANT
BAG DECANTER FOR FLEXI CONT (MISCELLANEOUS) ×1 IMPLANT
BAG SPEC THK2 15X12 ZIP CLS (MISCELLANEOUS) ×1
BAG SPNG CNTER NS LX DISP (BAG) ×1
BAG ZIPLOCK 12X15 (MISCELLANEOUS) ×2 IMPLANT
BLADE SAW SGTL 18X1.27X75 (BLADE) IMPLANT
BRUSH FEMORAL CANAL (MISCELLANEOUS) IMPLANT
COVER SURGICAL LIGHT HANDLE (MISCELLANEOUS) ×2 IMPLANT
DERMABOND ADVANCED (GAUZE/BANDAGES/DRESSINGS) ×1
DERMABOND ADVANCED .7 DNX12 (GAUZE/BANDAGES/DRESSINGS) ×1 IMPLANT
DRAPE INCISE IOBAN 85X60 (DRAPES) ×2 IMPLANT
DRAPE ORTHO SPLIT 77X108 STRL (DRAPES) ×4
DRAPE POUCH INSTRU U-SHP 10X18 (DRAPES) ×2 IMPLANT
DRAPE SURG 17X11 SM STRL (DRAPES) ×2 IMPLANT
DRAPE SURG ORHT 6 SPLT 77X108 (DRAPES) ×2 IMPLANT
DRAPE U-SHAPE 47X51 STRL (DRAPES) ×2 IMPLANT
DRSG AQUACEL AG ADV 3.5X10 (GAUZE/BANDAGES/DRESSINGS) IMPLANT
DRSG AQUACEL AG ADV 3.5X14 (GAUZE/BANDAGES/DRESSINGS) ×2 IMPLANT
DURAPREP 26ML APPLICATOR (WOUND CARE) ×3 IMPLANT
ELECT BLADE TIP CTD 4 INCH (ELECTRODE) ×2 IMPLANT
ELECT REM PT RETURN 15FT ADLT (MISCELLANEOUS) ×2 IMPLANT
FACESHIELD WRAPAROUND (MASK) ×8 IMPLANT
FACESHIELD WRAPAROUND OR TEAM (MASK) ×4 IMPLANT
GLOVE SURG ENC MOIS LTX SZ6 (GLOVE) ×4 IMPLANT
GLOVE SURG UNDER LTX SZ6.5 (GLOVE) ×2 IMPLANT
GLOVE SURG UNDER POLY LF SZ7.5 (GLOVE) ×2 IMPLANT
GOWN STRL REUS W/TWL LRG LVL3 (GOWN DISPOSABLE) ×4 IMPLANT
HANDPIECE INTERPULSE COAX TIP (DISPOSABLE) ×2
HOLDER FOLEY CATH W/STRAP (MISCELLANEOUS) ×1 IMPLANT
KIT BASIN OR (CUSTOM PROCEDURE TRAY) ×2 IMPLANT
KIT TURNOVER KIT A (KITS) IMPLANT
MANIFOLD NEPTUNE II (INSTRUMENTS) ×2 IMPLANT
NDL SAFETY ECLIPSE 18X1.5 (NEEDLE) ×1 IMPLANT
NEEDLE HYPO 18GX1.5 SHARP (NEEDLE) ×2
NS IRRIG 1000ML POUR BTL (IV SOLUTION) ×2 IMPLANT
PACK TOTAL JOINT (CUSTOM PROCEDURE TRAY) ×2 IMPLANT
PROTECTOR NERVE ULNAR (MISCELLANEOUS) ×2 IMPLANT
SET HNDPC FAN SPRY TIP SCT (DISPOSABLE) ×1 IMPLANT
SOLUTION IRRIG SURGIPHOR (IV SOLUTION) IMPLANT
SPONGE T-LAP 18X18 ~~LOC~~+RFID (SPONGE) ×5 IMPLANT
STAPLER VISISTAT 35W (STAPLE) IMPLANT
SUCTION FRAZIER HANDLE 12FR (TUBING) ×2
SUCTION TUBE FRAZIER 12FR DISP (TUBING) ×1 IMPLANT
SUT STRATAFIX PDS+ 0 24IN (SUTURE) ×2 IMPLANT
SUT VIC AB 1 CT1 36 (SUTURE) ×2 IMPLANT
SUT VIC AB 2-0 CT1 27 (SUTURE) ×4
SUT VIC AB 2-0 CT1 TAPERPNT 27 (SUTURE) ×2 IMPLANT
TOWEL OR 17X26 10 PK STRL BLUE (TOWEL DISPOSABLE) ×4 IMPLANT
TRAY FOLEY MTR SLVR 14FR STAT (SET/KITS/TRAYS/PACK) ×1 IMPLANT
TRAY FOLEY MTR SLVR 16FR STAT (SET/KITS/TRAYS/PACK) ×1 IMPLANT
TUBE SUCTION HIGH CAP CLEAR NV (SUCTIONS) ×2 IMPLANT
WATER STERILE IRR 1000ML POUR (IV SOLUTION) ×4 IMPLANT
YANKAUER SUCT BULB TIP 10FT TU (MISCELLANEOUS) ×1 IMPLANT

## 2021-10-03 NOTE — Op Note (Signed)
NAME: Miranda Graham, Miranda Graham. ?MEDICAL RECORD NO: [id number] ?ACCOUNT NO: [id number] ?DATE OF BIRTH: Jul 07, 1956 ?FACILITY: [facility] ?LOCATION: [facility] ?PHYSICIAN: Pietro Cassis. Alvan Dame, MD ? ?Operative Report  ? ?DATE OF PROCEDURE: 10/03/2021 ? 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 ? ?POSTOPERATIVE DIAGNOSES/FINDINGS:   ?1.  Stable-appearing acetabular component. ?2.  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 ? ?PROCEDURE:  Revision left total hip arthroplasty.  Revision involved removing her femoral head construct and replacing it.  It would be a single component revision. ? ?SURGEON:  Pietro Cassis. Alvan Dame, MD. ? ?ASSISTANT:  Costella Hatcher, PA-C.  Note that [name] was present for the entirety of the case from preoperative positioning, perioperative management of the operative extremity, general facilitation of the case and primary wound closure. ? ?ANESTHESIA:  Spinal. ? ?ESTIMATED BLOOD LOSS:  Less than 100 mL ? ?COMPLICATIONS:  None. ? ?DRAINS:  None. ? ?INDICATIONS OF THE PROCEDURE:  The patient is a very pleasant 66 year old female with a complex history involving her left hip.  Her index left total hip arthroplasty was performed with noted complication of recurrent instability, likely associated with  ?fracture involving her greater trochanter.  She became a patient of mine, at which point, we elected to proceed with a right total hip revision to provide stability for her gait and ambulation prior to considering revising her left hip.  Once she has  ?successfully recovered from her right hip we did perform a revision of her left total hip replacement.  We revised this due to a dual mobility construct to enhance stability and minimize risk for dislocation and recurrent instability.  During her  ?postoperative period, she has progressed well without complications, particularly with instability; however, she had persistent concerns for pain.  Given the early  proximity of pain and the appearance of her radiographs there was concern about her  ?acetabular component not growing in.  Given these findings, we discussed proceeding with surgery to identify and revise as necessary.  Risks of recurrent instability, infection, DVT were discussed and reviewed.  Consent was obtained for the benefit of  ?pain relief. ? ?DESCRIPTION OF PROCEDURE:  The patient was brought to the operative theater.  Once adequate anesthesia preoperative antibiotics administered, she was positioned into the right lateral decubitus position for a posterior approach for this revision.  The  ?left hip was then prepped and draped in sterile fashion.  Timeout was performed identifying the patient, planned procedure, and extremity.  I made a posterior approach to the hip.  Soft tissue dissection was carried down to identify no concern for  ?infection.  The iliotibial band and gluteal fascia were incised for posterior approach. As I entered into the posterior aspect of the hip we did encounter the posterior displaced greater trochanter.  Initially maintained this piece of trochanter;  ?however, due to the significant time from her index surgery it was not an issue of potentially trying to restore it or repair it.  Once I did my exposure we did not see evidence of obvious loosening of her acetabular component.  I dislocated the hip and  ?even with the dislocation of that which was challenging and required some pulling there was no evidence of the acetabular component moving.  Once I had removed the femoral head, I was able to expose more of the acetabulum for full visualization.  She was ? noted to have very little bone along the posterior aspect of her pelvis.  As I identified and  dissected out her ischium there was evidence of this being not united to her acetabular component.  There did appear to be mobility all the way even to the  ?anterior pubis.  Given this, I had significant concerns about attempting to  revise her acetabular knowing that she would have pelvic discontinuity.  I did evaluate the orientation of her acetabular shell within the pelvis in this lateral position and  ?felt that her acetabular component had not shifted.  In fact, it was in the position that put preoperative at the index time of surgery.  Additionally, I tried to move her shell with significant effort and was only able to move her pelvis.  Given this  ?entire picture, I felt that it was in her best interest for not to remove her acetabular shell and due to the concerns for significant pelvic discontinuity and lack of ability to have it repaired.  Given these findings, we irrigated her hip and then  ?revised and replaced the femoral head and ball construct for this dual mobility hip.  It was impacted on the clean and dried trunnion and the hip was reduced.  The hip was again reirrigated.  We then did elect at this point to excise the greater  ?trochanter fragment, which measured about 1.5 cm in diameter.  It was not attached too much to muscle.  We then reapproximated the iliotibial band and gluteal fascia using #1 Vicryl and #1 Stratafix suture.  The remainder of the wound was closed with 2-0 ? Vicryl and a running Monocryl stitch.  The hip was then cleaned, dried and dressed sterilely using surgical glue and Aquacel dressing.  She was then brought to the recovery room in stable condition, tolerating the procedure well. ? ?Postoperatively, I will have to have her remain partial weightbearing for her pain control.  I am uncertain if we will ever be able to progress her and we will always have concerns about failure of the acetabular and what will be able to be reconstructed ? in her pelvis.  This will be reviewed with her postoperatively. ? ? ?PUS ?D: 10/03/2021 10:53:24 am T: 10/03/2021 6:53:00 pm  ?JOB: [id number]/ [id number]  ?

## 2021-10-03 NOTE — Brief Op Note (Signed)
10/03/2021 ? ?10:44 AM ? ?PATIENT:  Miranda Graham  66 y.o. female ? ?PRE-OPERATIVE DIAGNOSIS:  Failed left total hip arthroplasty, acetabular ? ?POST-OPERATIVE DIAGNOSIS:  1. 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 ?PROCEDURE:  Procedure(s): ?TOTAL HIP REVISION, ACETABULAR (Left) ? ?SURGEON:  Surgeon(s) and Role: ?   Paralee Cancel, MD - Primary ? ?PHYSICIAN ASSISTANT: Costella Hatcher, PA-C ? ?ANESTHESIA:   spinal ? ?EBL:  100 mL  ? ?BLOOD ADMINISTERED:none ? ?DRAINS: none  ? ?LOCAL MEDICATIONS USED:  NONE ? ?SPECIMEN:  No Specimen ? ?DISPOSITION OF SPECIMEN:  N/A ? ?COUNTS:  YES ? ?TOURNIQUET:  * No tourniquets in log * ? ?DICTATION: .Other Dictation: Dictation Number [id number] ? ?PLAN OF CARE: Admit for overnight observation ? ?PATIENT DISPOSITION:  PACU - hemodynamically stable. ?  ?Delay start of Pharmacological VTE agent (>24hrs) due to surgical blood loss or risk of bleeding: no ? ?

## 2021-10-03 NOTE — Anesthesia Procedure Notes (Signed)
Spinal ? ?Patient location during procedure: OR ?Start time: 10/03/2021 9:04 AM ?End time: 10/03/2021 9:07 AM ?Reason for block: surgical anesthesia ?Staffing ?Performed: anesthesiologist  ?Anesthesiologist: Brennan Bailey, MD ?Preanesthetic Checklist ?Completed: patient identified, IV checked, risks and benefits discussed, surgical consent, monitors and equipment checked, pre-op evaluation and timeout performed ?Spinal Block ?Patient position: sitting ?Prep: DuraPrep and site prepped and draped ?Patient monitoring: continuous pulse ox, blood pressure and heart rate ?Approach: midline ?Location: L3-4 ?Injection technique: single-shot ?Needle ?Needle type: Pencan  ?Needle gauge: 24 G ?Needle length: 9 cm ?Assessment ?Events: CSF return ?Additional Notes ?Risks, benefits, and alternative discussed. Patient gave consent to procedure. Prepped and draped in sitting position. Patient sedated but responsive to voice. Clear CSF obtained with one needle pass. Positive terminal aspiration. No pain or paraesthesias with injection. Patient tolerated procedure well. Vital signs stable. Tawny Asal, MD ? ? ? ? ?

## 2021-10-03 NOTE — Evaluation (Signed)
Physical Therapy Evaluation ?Patient Details ?Name: Miranda Graham ?MRN: [id number] ?DOB: April 24, 1956 ?Today's Date: 10/03/2021 ? ?History of Present Illness ? Pt is a 66 y.o. female s/p Lt total hip revision. 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  ?Clinical Impression ? Pt is POD 0 s/p Lt total hip revision resulting in the deficits listed below (see PT Problem List). Pt performed sit to stand transfers with MIN guard for safety and cues for safe hand placement. Pt ambulated total of ~1101ft with  MIN guard intermittently progressing to supervision for safety. Cues provided for step to gait pattern with reminders for increased use of UEs on RW to adhere to PWB 50% on L LE, no LOB observed. Pt is very independent and hoping to maintain as much independence as possible with use of RW. PT reviewed therapeutic interventions for promotion of DVT prevention/ROM, pt demonstrated understanding. Pt will have intermittent assist from family upon d/c. Pt will benefit from skilled PT to maximize functional mobility to increase independence.  ?   ?   ? ?Recommendations for follow up therapy are one component of a multi-disciplinary discharge planning process, led by the attending physician.  Recommendations may be updated based on patient status, additional functional criteria and insurance authorization. ? ?Follow Up Recommendations Follow physician's recommendations for discharge plan and follow up therapies ? ?  ?Assistance Recommended at Discharge Intermittent Supervision/Assistance  ?Patient can return home with the following ? A little help with walking and/or transfers;A little help with bathing/dressing/bathroom;Assist for transportation;Help with stairs or ramp for entrance;Assistance with cooking/housework ? ?  ?Equipment Recommendations None recommended by PT (pt owns RW)  ?Recommendations for Other Services ?    ?  ?Functional Status Assessment Patient has had a recent decline  in their functional status and demonstrates the ability to make significant improvements in function in a reasonable and predictable amount of time.  ? ?  ?Precautions / Restrictions Precautions ?Precautions: Fall ?Restrictions ?Weight Bearing Restrictions: Yes ?LLE Weight Bearing: Partial weight bearing ?LLE Partial Weight Bearing Percentage or Pounds: 50% (per RN and pt, Pt was told she would be PWB for "rest of her life")  ? ?  ? ?Mobility ? Bed Mobility ?Overal bed mobility: Needs Assistance ?Bed Mobility: Supine to Sit ?  ?  ?Supine to sit: Supervision, HOB elevated ?  ?  ?  ?  ? ?Transfers ?Overall transfer level: Needs assistance ?Equipment used: Rolling walker (2 wheels) ?Transfers: Sit to/from Stand ?Sit to Stand: Min guard ?  ?  ?  ?  ?  ?General transfer comment: MIN guard with cues for safe hand placement ?  ? ?Ambulation/Gait ?Ambulation/Gait assistance: Min guard, Supervision ?Gait Distance (Feet): 120 Feet ?Assistive device: Rolling walker (2 wheels) ?Gait Pattern/deviations: Step-to pattern, Step-through pattern, Decreased stride length, Decreased weight shift to left ?Gait velocity: WNL ?  ?  ?General Gait Details: Cues for step to gait pattern and for use of B UEs to assist with PWB 50% on L LE. Pt requiring intermittent cuing to decrease pace and use UEs to assist in order to maintain 50% WB status on L, pt is very independent and concerned about 50% PWB status "for the rest of her life" will limit her independence ? ?Stairs ?  ?  ?  ?  ?  ? ?Wheelchair Mobility ?  ? ?Modified Rankin (Stroke Patients Only) ?  ? ?  ? ?Balance Overall balance assessment: Needs assistance ?Sitting-balance support: Feet supported ?Sitting  balance-Leahy Scale: Good ?  ?  ?Standing balance support: Bilateral upper extremity supported, During functional activity, Reliant on assistive device for balance ?Standing balance-Leahy Scale: Poor ?Standing balance comment: reliant on external support to adhere to PWB status on L  LE ?  ?  ?  ?  ?  ?  ?  ?  ?  ?  ?  ?   ? ? ? ?Pertinent Vitals/Pain Pain Assessment ?Pain Assessment: 0-10 ?Pain Score: 4  ?Pain Location: Lt hip ?Pain Descriptors / Indicators: Tightness, Sore, Discomfort ?Pain Intervention(s): Limited activity within patient's tolerance, Monitored during session, Repositioned, Ice applied  ? ? ?Home Living Family/patient expects to be discharged to:: Private residence ?Living Arrangements: Alone ?Available Help at Discharge: Family;Available PRN/intermittently ?Type of Home: House ?Home Access: Stairs to enter ?Entrance Stairs-Rails: Left ?Entrance Stairs-Number of Steps: 4 ?  ?Home Layout: One level ?Home Equipment: Conservation officer, nature (2 wheels);Cane - quad;Cane - single point ?Additional Comments: use uber/lyft when need to do stuff. Groceries delivered  ?  ?Prior Function Prior Level of Function : Independent/Modified Independent ?  ?  ?  ?  ?  ?  ?  ?  ?  ? ? ?Hand Dominance  ?   ? ?  ?Extremity/Trunk Assessment  ? Upper Extremity Assessment ?Upper Extremity Assessment: Overall WFL for tasks assessed ?  ? ?Lower Extremity Assessment ?Lower Extremity Assessment: LLE deficits/detail ?LLE Deficits / Details: pt with good quad set strength. B DF/PF strength 4+/5 ?  ? ?Cervical / Trunk Assessment ?Cervical / Trunk Assessment: Normal  ?Communication  ? Communication: No difficulties  ?Cognition Arousal/Alertness: Awake/alert ?Behavior During Therapy: [facility] for tasks assessed/performed ?Overall Cognitive Status: Within Functional Limits for tasks assessed ?  ?  ?  ?  ?  ?  ?  ?  ?  ?  ?  ?  ?  ?  ?  ?  ?  ?  ?  ? ?  ?General Comments   ? ?  ?Exercises Total Joint Exercises ?Ankle Circles/Pumps: AROM, Both, 20 reps, Seated ?Quad Sets: AROM, Left, 10 reps, Seated ?Long Arc Quad: AROM, Left, 10 reps, Seated  ? ?Assessment/Plan  ?  ?PT Assessment Patient needs continued PT services  ?PT Problem List Decreased strength;Decreased range of motion;Decreased activity tolerance;Decreased  balance;Decreased mobility;Decreased knowledge of use of DME;Pain ? ?   ?  ?PT Treatment Interventions DME instruction;Gait training;Stair training;Functional mobility training;Therapeutic activities;Therapeutic exercise;Balance training;Patient/family education   ? ?PT Goals (Current goals can be found in the Care Plan section)  ?Acute Rehab PT Goals ?Patient Stated Goal: Want to maintain as much independence as possible ?PT Goal Formulation: With patient ?Time For Goal Achievement: 10/17/21 ?Potential to Achieve Goals: Good ? ?  ?Frequency 7X/week ?  ? ? ?Co-evaluation   ?  ?  ?  ?  ? ? ?  ?AM-PAC PT "6 Clicks" Mobility  ?Outcome Measure Help needed turning from your back to your side while in a flat bed without using bedrails?: None ?Help needed moving from lying on your back to sitting on the side of a flat bed without using bedrails?: A Little ?Help needed moving to and from a bed to a chair (including a wheelchair)?: A Little ?Help needed standing up from a chair using your arms (e.g., wheelchair or bedside chair)?: A Little ?Help needed to walk in hospital room?: A Little ?Help needed climbing 3-5 steps with a railing? : A Lot ?6 Click Score: 18 ? ?  ?End of Session Equipment  Utilized During Treatment: Gait belt ?Activity Tolerance: Patient tolerated treatment well ?Patient left: in chair;with call bell/phone within reach ?Nurse Communication: Mobility status ?PT Visit Diagnosis: Unsteadiness on feet (R26.81);Muscle weakness (generalized) (M62.81);Pain;Other abnormalities of gait and mobility (R26.89) ?Pain - Right/Left: Left ?Pain - part of body: Hip ?  ? ?Time: 6754-4920 ?PT Time Calculation (min) (ACUTE ONLY): 24 min ? ? ?Charges:   PT Evaluation ?$PT Eval Low Complexity: 1 Low ?PT Treatments ?$Gait Training: 8-22 mins ?  ?   ? ?Festus Barren., PT, DPT  ?Acute Rehabilitation Services  ?Office [phone] ? ?10/03/2021, 6:23 PM ? ?

## 2021-10-03 NOTE — Discharge Instructions (Signed)

## 2021-10-03 NOTE — Transfer of Care (Signed)
Immediate Anesthesia Transfer of Care Note ? ?Patient: Miranda Graham ? ?Procedure(s) Performed: TOTAL HIP REVISION, ACETABULAR (Left: Hip) ? ?Patient Location: PACU ? ?Anesthesia Type:Spinal ? ?Level of Consciousness: awake, alert , oriented and patient cooperative ? ?Airway & Oxygen Therapy: Patient Spontanous Breathing and Patient connected to face mask oxygen ? ?Post-op Assessment: Report given to RN and Post -op Vital signs reviewed and stable ? ?Post vital signs: Reviewed and stable ? ?Last Vitals:  ?Vitals Value Taken Time  ?BP 106/61 10/03/21 1051  ?Temp    ?Pulse 77 10/03/21 1056  ?Resp 17 10/03/21 1056  ?SpO2 94 % 10/03/21 1056  ?Vitals shown include unvalidated device data. ? ?Last Pain:  ?Vitals:  ? 10/03/21 0629  ?TempSrc:   ?PainSc: 4   ?   ? ?Patients Stated Pain Goal: 3 (10/03/21 4888) ? ?Complications: No notable events documented. ?

## 2021-10-04 ENCOUNTER — Encounter (HOSPITAL_COMMUNITY): Payer: Self-pay | Admitting: Orthopedic Surgery

## 2021-10-04 LAB — CBC
HCT: 29.8 % — ABNORMAL LOW (ref 36.0–46.0)
Hemoglobin: 9 g/dL — ABNORMAL LOW (ref 12.0–15.0)
MCH: 27.6 pg (ref 26.0–34.0)
MCHC: 30.2 g/dL (ref 30.0–36.0)
MCV: 91.4 fL (ref 80.0–100.0)
Platelets: 254 10*3/uL (ref 150–400)
RBC: 3.26 MIL/uL — ABNORMAL LOW (ref 3.87–5.11)
RDW: 17.3 % — ABNORMAL HIGH (ref 11.5–15.5)
WBC: 10.3 10*3/uL (ref 4.0–10.5)
nRBC: 0 % (ref 0.0–0.2)

## 2021-10-04 LAB — BASIC METABOLIC PANEL
Anion gap: 13 (ref 5–15)
BUN: 18 mg/dL (ref 8–23)
CO2: 22 mmol/L (ref 22–32)
Calcium: 8.3 mg/dL — ABNORMAL LOW (ref 8.9–10.3)
Chloride: 104 mmol/L (ref 98–111)
Creatinine, Ser: 0.61 mg/dL (ref 0.44–1.00)
GFR, Estimated: >60 mL/min (ref >60)
Glucose, Bld: 139 mg/dL — ABNORMAL HIGH (ref 70–99)
Potassium: 3.4 mmol/L — ABNORMAL LOW (ref 3.5–5.1)
Sodium: 139 mmol/L (ref 135–145)

## 2021-10-04 MED ORDER — ASPIRIN 81 MG PO CHEW: 81.0000 mg | CHEWABLE_TABLET | Freq: Two times a day (BID) | ORAL | 0 refills | Status: DC

## 2021-10-04 MED ORDER — POLYETHYLENE GLYCOL 3350 17 G PO PACK: 17.0000 g | PACK | Freq: Every day | ORAL | 0 refills | Status: AC | PRN

## 2021-10-04 MED ORDER — DOCUSATE SODIUM 100 MG PO CAPS: 100.0000 mg | ORAL_CAPSULE | Freq: Two times a day (BID) | ORAL | 0 refills | Status: AC

## 2021-10-04 MED ORDER — HYDROCODONE-ACETAMINOPHEN 7.5-325 MG PO TABS: 1.0000 | ORAL_TABLET | Freq: Four times a day (QID) | ORAL | 0 refills | Status: AC | PRN

## 2021-10-04 MED ORDER — METHOCARBAMOL 500 MG PO TABS
500.0000 mg | ORAL_TABLET | Freq: Four times a day (QID) | ORAL | 0 refills | Status: AC | PRN
Start: 2021-10-04 — End: ?

## 2021-10-04 MED ORDER — DOCUSATE SODIUM 100 MG PO CAPS
100.0000 mg | ORAL_CAPSULE | Freq: Two times a day (BID) | ORAL | 0 refills | Status: DC
Start: 2021-10-04 — End: 2021-10-04

## 2021-10-04 MED ORDER — POLYETHYLENE GLYCOL 3350 17 G PO PACK: 17.0000 g | PACK | Freq: Every day | ORAL | 0 refills | Status: DC | PRN

## 2021-10-04 MED ORDER — ASPIRIN 81 MG PO CHEW: 81.0000 mg | CHEWABLE_TABLET | Freq: Two times a day (BID) | ORAL | 0 refills | Status: AC

## 2021-10-04 MED ORDER — METHOCARBAMOL 500 MG PO TABS
500.0000 mg | ORAL_TABLET | Freq: Four times a day (QID) | ORAL | 0 refills | Status: DC | PRN
Start: 2021-10-04 — End: 2021-10-04

## 2021-10-04 MED ORDER — HYDROCODONE-ACETAMINOPHEN 7.5-325 MG PO TABS: 1.0000 | ORAL_TABLET | Freq: Four times a day (QID) | ORAL | 0 refills | Status: DC | PRN

## 2021-10-04 NOTE — Progress Notes (Signed)
Patient discharged to home w/ family. Given all belongings, instructions, equipment. Verbalized understanding of instructions. Escorted to pov via w/c. 

## 2021-10-04 NOTE — Anesthesia Postprocedure Evaluation (Signed)
Anesthesia Post Note ? ?Patient: Miranda Graham ? ?Procedure(s) Performed: SINGLE COMPONENT REVISION (Left: Hip) ? ?  ? ?Patient location during evaluation: PACU ?Anesthesia Type: Spinal ?Level of consciousness: awake and alert ?Pain management: pain level controlled ?Vital Signs Assessment: post-procedure vital signs reviewed and stable ?Respiratory status: spontaneous breathing, nonlabored ventilation and respiratory function stable ?Cardiovascular status: blood pressure returned to baseline ?Postop Assessment: no apparent nausea or vomiting, spinal receding, no headache and no backache ?Anesthetic complications: no ? ? ?No notable events documented. ? ?  ?  ?  ? ?Marthenia Rolling ? ? ? ? ?

## 2021-10-04 NOTE — TOC Transition Note (Signed)
Transition of Care (TOC) - CM/SW Discharge Note ? ?Patient Details  ?Name: ROGINA SCHIANO ?MRN: [id number] ?Date of Birth: 1955/10/06 ? ?Transition of Care (TOC) CM/SW Contact:  ?Sherie Don, LCSW ?Phone Number: ?10/04/2021, 10:55 AM ? ?Clinical Narrative: Patient is expected to discharge home after working with PT. CSW met with patient to confirm discharge plan and needs. Patient will discharge home with a home exercise program (HEP). Patient reported she has a rolling walker at home, but has been using the youth rolling walker at the hospital and would prefer using the smaller walker given her height. Per Andee Poles with [employer], patient received a walker through her Medicare in 2021 so it would cost $47 to private pay for the walker. Patient is agreeable to private pay. [employer] to deliver walker to patient's room. TOC signing off. ? ?Final next level of care: Home/Self Care ?Barriers to Discharge: No Barriers Identified ? ?Patient Goals and CMS Choice ?Patient states their goals for this hospitalization and ongoing recovery are:: Discharge home with HEP ?CMS Medicare.gov Compare Post Acute Care list provided to:: Patient ?Choice offered to / list presented to : Patient ? ?Discharge Plan and Services        ?DME Arranged: Walker rolling ?DME Agency: [employer] ?Date DME Agency Contacted: 10/04/21 ?Representative spoke with at DME Agency: Andee Poles ? ?Readmission Risk Interventions ?No flowsheet data found. ? ?

## 2021-10-04 NOTE — Progress Notes (Signed)
? ?  Subjective: ?1 Day Post-Op Procedure(s) (LRB): ?SINGLE COMPONENT REVISION (Left) ?Patient reports pain as moderate.   ?Patient seen in rounds with Dr. Alvan Dame. ?Patient is resting in bed on exam this morning. Dr. Alvan Dame and the patient had a long conversation about her situation and expectations for the future. The patient was tearful. Ambulated 120 feet with PT.  ?We will continue therapy today.  ? ?Objective: ?Vital signs in last 24 hours: ?Temp:  [97.6 ?F (36.4 ?C)-99.3 ?F (37.4 ?C)] 98.7 ?F (37.1 ?C) (03/03 0545) ?Pulse Rate:  [69-93] 79 (03/03 0545) ?Resp:  [12-19] 17 (03/03 0545) ?BP: (106-144)/(57-75) 122/68 (03/03 0545) ?SpO2:  [92 %-100 %] 97 % (03/03 0545) ? ?Intake/Output from previous day: ? ?Intake/Output Summary (Last 24 hours) at 10/04/2021 0735 ?Last data filed at 10/04/2021 0600 ?Gross per 24 hour  ?Intake 3356.7 ml  ?Output 2080 ml  ?Net 1276.7 ml  ?  ? ?Intake/Output this shift: ?No intake/output data recorded. ? ?Labs: ?Recent Labs  ?  10/01/21 ?1015 10/04/21 ?0307  ?HGB 11.6* 9.0*  ? ?Recent Labs  ?  10/01/21 ?1015 10/04/21 ?0307  ?WBC 8.3 10.3  ?RBC 4.23 3.26*  ?HCT 37.7 29.8*  ?PLT 355 254  ? ?Recent Labs  ?  10/01/21 ?1015 10/04/21 ?0307  ?NA 140 139  ?K 3.7 3.4*  ?CL 105 104  ?CO2 28 22  ?BUN 16 18  ?CREATININE 0.83 0.61  ?GLUCOSE 100* 139*  ?CALCIUM 9.9 8.3*  ? ?No results for input(s): LABPT, INR in the last 72 hours. ? ?Exam: ?General - Patient is Alert and Oriented ?Extremity - Neurologically intact ?Sensation intact distally ?Intact pulses distally ?Dorsiflexion/Plantar flexion intact ?Dressing -  ?Motor Function - intact, moving foot and toes well on exam.  ? ?Past Medical History:  ?Diagnosis Date  ? Anemia   ? Arthritis   ? GERD (gastroesophageal reflux disease)   ? History of kidney stones   ? Ovarian cancer ([place])   ? Pneumonia   ? ? ?Assessment/Plan: ?1 Day Post-Op Procedure(s) (LRB): ?SINGLE COMPONENT REVISION (Left) ?Principal Problem: ?  s/p  revision of total replacement of left hip  joint ?Active Problems: ?  S/P revision of total hip ? ?Estimated body mass index is 31.74 kg/m? as calculated from the following: ?  Height as of this encounter: 5\' 1"  (1.549 m). ?  Weight as of this encounter: 76.2 kg. ?Advance diet ?Up with therapy ?D/C IV fluids ? ?DVT Prophylaxis - Aspirin ?PWB LLE 50% ? ?ABLA on chronic anemia - Hgb 9 this AM ? ?Plan is to go Home after hospital stay. Plan for discharge after meeting goals with PT. Follow up in the office in 2 weeks.  ? ?Griffith Citron, PA-C ?Orthopedic Surgery ?[phone] ?10/04/2021, 7:35 AM  ?

## 2021-10-04 NOTE — Progress Notes (Signed)
Physical Therapy Treatment ?Patient Details ?Name: Miranda Graham ?MRN: [id number] ?DOB: [birth year] ?Today's Date: 10/04/2021 ? ? ?History of Present Illness Pt is a 66 y.o. female s/p Lt total hip revision. 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 ? ?  ?PT Comments  ? ? Pt very cooperative and motivated to progress.  Pt up to EOB and stood unassisted.  Pt ambulated in hall with intermittent cueing for posture and position from RW to insure PWB.  Pt negotiated stairs with rail, QC and cues. Pt states feels comfortable with ability to function at home, "I've been doing this for a while now", and eager for dc.   ?Recommendations for follow up therapy are one component of a multi-disciplinary discharge planning process, led by the attending physician.  Recommendations may be updated based on patient status, additional functional criteria and insurance authorization. ? ?Follow Up Recommendations ? Follow physician's recommendations for discharge plan and follow up therapies ?  ?  ?Assistance Recommended at Discharge Intermittent Supervision/Assistance  ?Patient can return home with the following A little help with walking and/or transfers;A little help with bathing/dressing/bathroom;Assist for transportation;Help with stairs or ramp for entrance;Assistance with cooking/housework ?  ?Equipment Recommendations ? None recommended by PT  ?  ?Recommendations for Other Services   ? ? ?  ?Precautions / Restrictions Precautions ?Precautions: Fall ?Restrictions ?Weight Bearing Restrictions: Yes ?LLE Weight Bearing: Partial weight bearing ?LLE Partial Weight Bearing Percentage or Pounds: 50%  ?  ? ?Mobility ? Bed Mobility ?Overal bed mobility: Needs Assistance ?Bed Mobility: Supine to Sit ?  ?  ?Supine to sit: Supervision ?  ?  ?General bed mobility comments: Increased time but no physical assist ?  ? ?Transfers ?Overall transfer level: Needs assistance ?Equipment used: Rolling walker (2  wheels) ?Transfers: Sit to/from Stand ?Sit to Stand: Supervision ?  ?  ?  ?  ?  ?General transfer comment: Pt self cues for use of UEs to self assist ?  ? ?Ambulation/Gait ?Ambulation/Gait assistance: Min guard, Supervision ?Gait Distance (Feet): 120 Feet ?Assistive device: Rolling walker (2 wheels) ?Gait Pattern/deviations: Step-to pattern, Decreased step length - right, Decreased step length - left, Shuffle, Trunk flexed, Antalgic ?  ?  ?  ?General Gait Details: Cues for step to gait pattern and for use of B UEs to assist with PWB 50% on L LE. Pt requiring intermittent cuing to decrease pace, use UEs to assist in order to maintain 50% WB status on L, and position in RW. ? ? ?Stairs ?Stairs: Yes ?Stairs assistance: Min guard ?Stair Management: One rail Left, Step to pattern, Forwards, With cane ?Number of Stairs: 5 ?General stair comments: 2+3 stairs with cues for sequence and foot/QC placement ? ? ?Wheelchair Mobility ?  ? ?Modified Rankin (Stroke Patients Only) ?  ? ? ?  ?Balance Overall balance assessment: Needs assistance ?Sitting-balance support: Feet supported ?Sitting balance-Leahy Scale: Good ?  ?  ?Standing balance support: No upper extremity supported ?Standing balance-Leahy Scale: Fair ?  ?  ?  ?  ?  ?  ?  ?  ?  ?  ?  ?  ?  ? ?  ?Cognition Arousal/Alertness: Awake/alert ?Behavior During Therapy: [facility] for tasks assessed/performed ?Overall Cognitive Status: Within Functional Limits for tasks assessed ?  ?  ?  ?  ?  ?  ?  ?  ?  ?  ?  ?  ?  ?  ?  ?  ?  ?  ?  ? ?  ?  Exercises   ? ?  ?General Comments   ?  ?  ? ?Pertinent Vitals/Pain Pain Assessment ?Pain Assessment: 0-10 ?Pain Score: 3  ?Pain Location: Lt hip ?Pain Descriptors / Indicators: Tightness, Sore, Discomfort ?Pain Intervention(s): Limited activity within patient's tolerance, Monitored during session, Premedicated before session  ? ? ?Home Living   ?  ?  ?  ?  ?  ?  ?  ?  ?  ?   ?  ?Prior Function    ?  ?  ?   ? ?PT Goals (current goals can now be  found in the care plan section) Acute Rehab PT Goals ?Patient Stated Goal: Want to maintain as much independence as possible ?PT Goal Formulation: With patient ?Time For Goal Achievement: 10/17/21 ?Potential to Achieve Goals: Good ?Progress towards PT goals: Progressing toward goals ? ?  ?Frequency ? ? ? 7X/week ? ? ? ?  ?PT Plan Current plan remains appropriate  ? ? ?Co-evaluation   ?  ?  ?  ?  ? ?  ?AM-PAC PT "6 Clicks" Mobility   ?Outcome Measure ? Help needed turning from your back to your side while in a flat bed without using bedrails?: None ?Help needed moving from lying on your back to sitting on the side of a flat bed without using bedrails?: None ?Help needed moving to and from a bed to a chair (including a wheelchair)?: A Little ?Help needed standing up from a chair using your arms (e.g., wheelchair or bedside chair)?: A Little ?Help needed to walk in hospital room?: A Little ?Help needed climbing 3-5 steps with a railing? : A Little ?6 Click Score: 20 ? ?  ?End of Session Equipment Utilized During Treatment: Gait belt ?Activity Tolerance: Patient tolerated treatment well ?Patient left: Other (comment) (sitting EOB) ?Nurse Communication: Mobility status ?PT Visit Diagnosis: Unsteadiness on feet (R26.81);Muscle weakness (generalized) (M62.81);Pain;Other abnormalities of gait and mobility (R26.89) ?Pain - Right/Left: Left ?Pain - part of body: Hip ?  ? ? ?Time: 0815-0900 ?PT Time Calculation (min) (ACUTE ONLY): 45 min ? ?Charges:  $Gait Training: 8-22 mins ?$Therapeutic Activity: 8-22 mins          ?          ? ?Debe Coder PT ?Acute Rehabilitation Services ?Pager [phone] ?Office [phone] ? ? ? ?Miranda Graham ?10/04/2021, 9:51 AM ? ?

## 2021-10-17 NOTE — Discharge Summary (Signed)
Patient ID: ?Geni Bers ?MRN: [id number] ?DOB/AGE: October 26, 1955 66 y.o. ? ?Admit date: 10/03/2021 ?Discharge date: 10/04/2021 ? ?Admission Diagnoses:  ?Failed left total hip arthroplasty  ? ?Discharge Diagnoses:  ?Principal Problem: ?  s/p  revision of total replacement of left hip joint ?Active Problems: ?  S/P revision of total hip ? ? ?Past Medical History:  ?Diagnosis Date  ? Anemia   ? Arthritis   ? GERD (gastroesophageal reflux disease)   ? History of kidney stones   ? Ovarian cancer ([place])   ? Pneumonia   ? ? ?Surgeries: Procedure(s): ?SINGLE COMPONENT REVISION on 10/03/2021 ?  ?Consultants:  ? ?Discharged Condition: Improved ? ?Hospital Course: JAMIESON LISA is an 65 y.o. female who was admitted 10/03/2021 for operative treatment ofHistory of revision of total replacement of left hip joint. Patient has severe unremitting pain that affects sleep, daily activities, and work/hobbies. After pre-op clearance the patient was taken to the operating room on 10/03/2021 and underwent  Procedure(s): ?SINGLE COMPONENT REVISION.   ? ?Patient was given perioperative antibiotics:  ?Anti-infectives (From admission, onward)  ? ? Start     Dose/Rate Route Frequency Ordered Stop  ? 10/03/21 1500  ceFAZolin (ANCEF) IVPB 2g/100 mL premix       ? 2 g ?200 mL/hr over 30 Minutes Intravenous Every 6 hours 10/03/21 1146 10/03/21 2212  ? 10/03/21 0615  ceFAZolin (ANCEF) IVPB 2g/100 mL premix       ? 2 g ?200 mL/hr over 30 Minutes Intravenous On call to O.R. 10/03/21 4098 10/03/21 0918  ? ?  ?  ? ?Patient was given sequential compression devices, early ambulation, and chemoprophylaxis to prevent DVT. Patient worked with PT and was meeting their goals regarding safe ambulation and transfers. ? ?Patient benefited maximally from hospital stay and there were no complications.   ? ?Recent vital signs: No data found.  ? ?Recent laboratory studies: No results for input(s): WBC, HGB, HCT, PLT, NA, K, CL, CO2, BUN, CREATININE, GLUCOSE, INR,  CALCIUM in the last 72 hours. ? ?Invalid input(s): PT, 2 ? ? ?Discharge Medications:   ?Allergies as of 10/04/2021   ? ?   Reactions  ? Shellfish Allergy Itching, Swelling, Other (See Comments)  ? Facial swelling  ? Levofloxacin Other (See Comments)  ? Muscle Pain ?Pain in calves, muscle  ? ?  ? ?  ?Medication List  ?  ? ?STOP taking these medications   ? ?ibuprofen 200 MG tablet ?Commonly known as: ADVIL ?  ?traMADol 50 MG tablet ?Commonly known as: ULTRAM ?  ? ?  ? ?TAKE these medications   ? ?aspirin 81 MG chewable tablet ?Chew 1 tablet (81 mg total) by mouth 2 (two) times daily for 28 days. ?  ?docusate sodium 100 MG capsule ?Commonly known as: COLACE ?Take 1 capsule (100 mg total) by mouth 2 (two) times daily. ?  ?ferrous sulfate 325 (65 FE) MG tablet ?Take 1 tablet (325 mg total) by mouth 3 (three) times daily after meals for 14 days. ?  ?HYDROcodone-acetaminophen 7.5-325 MG tablet ?Commonly known as: NORCO ?Take 1-2 tablets by mouth every 6 (six) hours as needed for severe pain (pain score 7-10). ?  ?methocarbamol 500 MG tablet ?Commonly known as: ROBAXIN ?Take 1 tablet (500 mg total) by mouth every 6 (six) hours as needed for muscle spasms. ?  ?methylphenidate 20 MG tablet ?Commonly known as: RITALIN ?Take 20 mg by mouth 4 (four) times daily. ?  ?multivitamin with minerals Tabs tablet ?Take 1 tablet  by mouth in the morning. ?  ?Omeprazole-Sodium Bicarbonate 20-1100 MG Caps capsule ?Commonly known as: ZEGERID ?Take 1 capsule by mouth daily. ?  ?polyethylene glycol 17 g packet ?Commonly known as: MIRALAX / GLYCOLAX ?Take 17 g by mouth daily as needed for mild constipation. ?  ? ?  ? ?  ?  ? ? ?  ?Discharge Care Instructions  ?(From admission, onward)  ?  ? ? ?  ? ?  Start     Ordered  ? 10/04/21 0000  Change dressing       ?Comments: Maintain surgical dressing until follow up in the clinic. If the edges start to pull up, may reinforce with tape. If the dressing is no longer working, may remove and cover with  gauze and tape, but must keep the area dry and clean.  Call with any questions or concerns.  ? 10/04/21 0743  ? ?  ?  ? ?  ? ? ?Diagnostic Studies: DG Pelvis Portable ? ?Result Date: 10/03/2021 ?CLINICAL DATA:  LEFT hip surgery today EXAM: PORTABLE PELVIS 1-2 VIEWS COMPARISON:  Portable exam 1207 hours compared to 05/10/2021 FINDINGS: BILATERAL hip prostheses. Bones demineralized. Proximal femora intact. Fractures of the inferior pubic rami, subacute/healing on LEFT, age indeterminate on RIGHT. LEFT acetabulum protrusio new since prior exam. SI joints preserved. Surgical clips in pelvis bilaterally. IMPRESSION: BILATERAL hip prostheses without acute complication. BILATERAL inferior pubic rami fractures, subacute/healing on the LEFT, age indeterminate on RIGHT but new since previous exam. Electronically Signed   By: Lavonia Dana M.D.   On: 10/03/2021 12:49   ? ?Disposition: Discharge disposition: 01-Home or Self Care ? ? ? ? ? ? ?Discharge Instructions   ? ? Call MD / Call 911   Complete by: As directed ?  ? If you experience chest pain or shortness of breath, CALL 911 and be transported to the hospital emergency room.  If you develope a fever above 101 F, pus (white drainage) or increased drainage or redness at the wound, or calf pain, call your surgeon's office.  ? Change dressing   Complete by: As directed ?  ? Maintain surgical dressing until follow up in the clinic. If the edges start to pull up, may reinforce with tape. If the dressing is no longer working, may remove and cover with gauze and tape, but must keep the area dry and clean.  Call with any questions or concerns.  ? Constipation Prevention   Complete by: As directed ?  ? Drink plenty of fluids.  Prune juice may be helpful.  You may use a stool softener, such as Colace (over the counter) 100 mg twice a day.  Use MiraLax (over the counter) for constipation as needed.  ? Diet - low sodium heart healthy   Complete by: As directed ?  ? Increase activity  slowly as tolerated   Complete by: As directed ?  ? Weight bearing as tolerated with assist device (walker, cane, etc) as directed, use it as long as suggested by your surgeon or therapist, typically at least 4-6 weeks.  ? Post-operative opioid taper instructions:   Complete by: As directed ?  ? POST-OPERATIVE OPIOID TAPER INSTRUCTIONS: ?It is important to wean off of your opioid medication as soon as possible. If you do not need pain medication after your surgery it is ok to stop day one. ?Opioids include: ?Codeine, Hydrocodone(Norco, Vicodin), Oxycodone(Percocet, oxycontin) and hydromorphone amongst others.  ?Long term and even short term use of opiods can cause: ?Increased  pain response ?Dependence ?Constipation ?Depression ?Respiratory depression ?And more.  ?Withdrawal symptoms can include ?Flu like symptoms ?Nausea, vomiting ?And more ?Techniques to manage these symptoms ?Hydrate well ?Eat regular healthy meals ?Stay active ?Use relaxation techniques(deep breathing, meditating, yoga) ?Do Not substitute Alcohol to help with tapering ?If you have been on opioids for less than two weeks and do not have pain than it is ok to stop all together.  ?Plan to wean off of opioids ?This plan should start within one week post op of your joint replacement. ?Maintain the same interval or time between taking each dose and first decrease the dose.  ?Cut the total daily intake of opioids by one tablet each day ?Next start to increase the time between doses. ?The last dose that should be eliminated is the evening dose.  ? ?  ? TED hose   Complete by: As directed ?  ? Use stockings (TED hose) for 2 weeks on both leg(s).  You may remove them at night for sleeping.  ? ?  ? ? ? Follow-up Information   ? ? Paralee Cancel, MD. Schedule an appointment as soon as possible for a visit in 2 week(s).   ?Specialty: Orthopedic Surgery ?Contact information: ?[address] ?[address] ?[place] [address] ?[phone] ? ? ?  ?  ? ?  ?   ? ?  ? ? ? ?Signed: ?Irving Copas ?10/17/2021, 7:25 AM ? ? ? ?

## 2023-06-15 ENCOUNTER — Ambulatory Visit: Payer: Medicare Other | Attending: Internal Medicine | Admitting: Internal Medicine

## 2023-10-17 IMAGING — DX DG PORTABLE PELVIS
1 series · 1 of 1 positions shown · non-contrast
Comparison: Portable exam 7449 hours compared to 05/10/2021

CLINICAL DATA: LEFT hip surgery today

EXAM:
PORTABLE PELVIS 1-2 VIEWS

[pelvis ap]
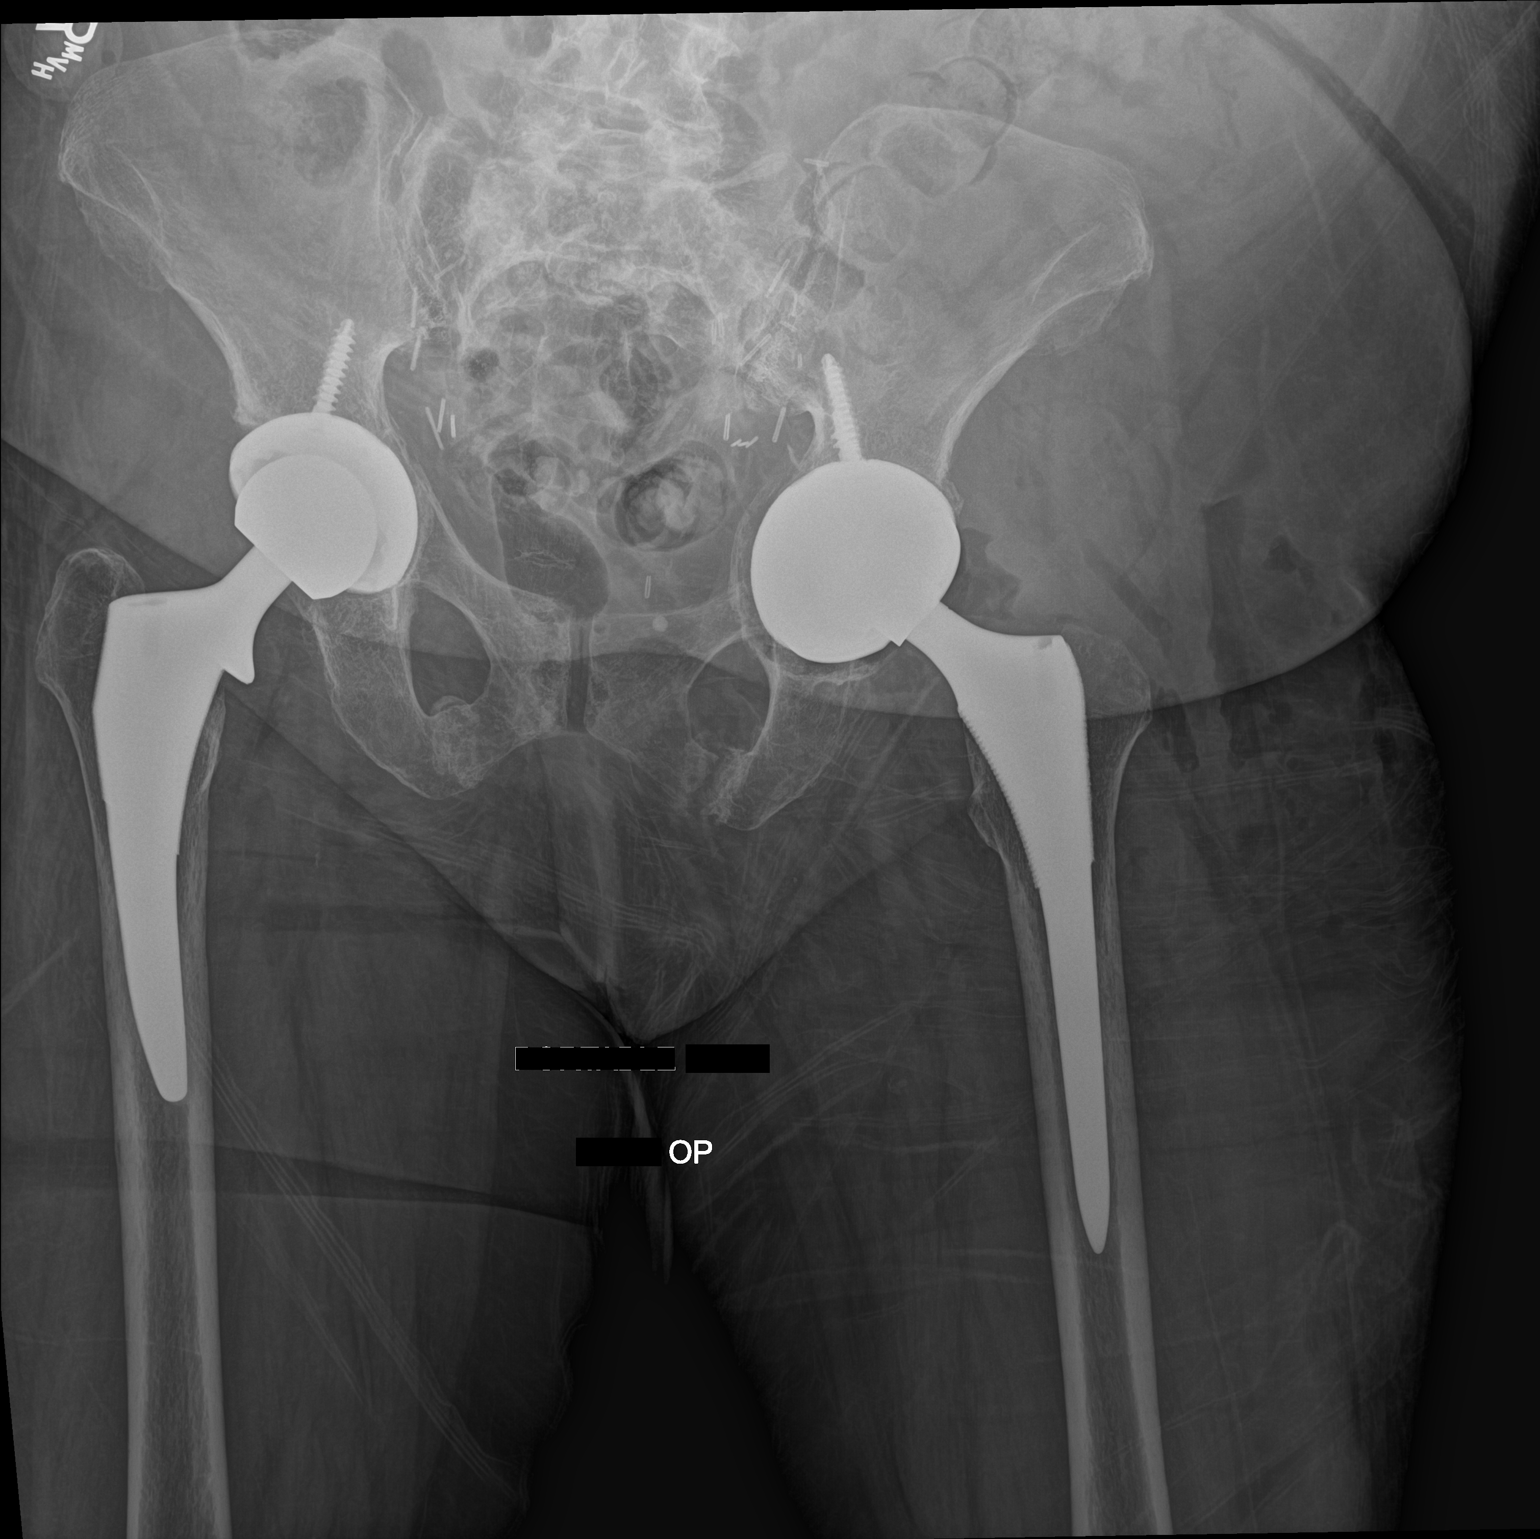

[1 of 1 positions shown; findings below may reference images not displayed]

FINDINGS: BILATERAL hip prostheses.

Bones demineralized.

Proximal femora intact.

Fractures of the inferior pubic rami, subacute/healing on LEFT, age
indeterminate on RIGHT.

LEFT acetabulum protrusio new since prior exam.

SI joints preserved.

Surgical clips in pelvis bilaterally.
IMPRESSION: BILATERAL hip prostheses without acute complication.

BILATERAL inferior pubic rami fractures, subacute/healing on the
LEFT, age indeterminate on RIGHT but new since previous exam.
